# Patient Record
Sex: Female | Born: 1935 | Race: Black or African American | Hispanic: No | Marital: Married | Smoking: Never smoker
Health system: Southern US, Community
[De-identification: ages and names within clinical notes are randomized; demographics above are authoritative.]

## PROBLEM LIST (undated history)

## (undated) DIAGNOSIS — F0390 Unspecified dementia without behavioral disturbance: Secondary | ICD-10-CM

## (undated) DIAGNOSIS — I609 Nontraumatic subarachnoid hemorrhage, unspecified: Secondary | ICD-10-CM

## (undated) DIAGNOSIS — I10 Essential (primary) hypertension: Secondary | ICD-10-CM

## (undated) DIAGNOSIS — E7800 Pure hypercholesterolemia, unspecified: Secondary | ICD-10-CM

## (undated) HISTORY — PX: ABDOMINAL HYSTERECTOMY: SHX81

---
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
## 2007-04-18 ENCOUNTER — Emergency Department (HOSPITAL_COMMUNITY): Admission: EM | Admit: 2007-04-18 | Discharge: 2007-04-18 | Payer: Self-pay | Admitting: Emergency Medicine

## 2011-02-20 LAB — DIFFERENTIAL
Basophils Absolute: 0
Lymphocytes Relative: 10 — ABNORMAL LOW
Neutro Abs: 12.3 — ABNORMAL HIGH

## 2011-02-20 LAB — URINE MICROSCOPIC-ADD ON

## 2011-02-20 LAB — URINALYSIS, ROUTINE W REFLEX MICROSCOPIC
Glucose, UA: NEGATIVE
Protein, ur: NEGATIVE
Urobilinogen, UA: 0.2

## 2011-02-20 LAB — BASIC METABOLIC PANEL
BUN: 11
Calcium: 10.1
GFR calc non Af Amer: >60
Glucose, Bld: 95
Sodium: 132 — ABNORMAL LOW

## 2011-02-20 LAB — CBC
Platelets: 166
RDW: 13.6

## 2015-03-03 ENCOUNTER — Emergency Department (HOSPITAL_COMMUNITY): Payer: Medicare (Managed Care)

## 2015-03-03 ENCOUNTER — Inpatient Hospital Stay (HOSPITAL_COMMUNITY)
Admission: EM | Admit: 2015-03-03 | Discharge: 2015-03-11 | DRG: 065 | Disposition: A | Discharge status: Skilled Nursing Facility | Payer: Medicare (Managed Care) | Attending: Neurology | Admitting: Neurology

## 2015-03-03 ENCOUNTER — Encounter (HOSPITAL_COMMUNITY): Payer: Self-pay | Admitting: Emergency Medicine

## 2015-03-03 DIAGNOSIS — I619 Nontraumatic intracerebral hemorrhage, unspecified: Secondary | ICD-10-CM | POA: Diagnosis not present

## 2015-03-03 DIAGNOSIS — I610 Nontraumatic intracerebral hemorrhage in hemisphere, subcortical: Secondary | ICD-10-CM | POA: Diagnosis not present

## 2015-03-03 DIAGNOSIS — I10 Essential (primary) hypertension: Secondary | ICD-10-CM | POA: Diagnosis not present

## 2015-03-03 DIAGNOSIS — E119 Type 2 diabetes mellitus without complications: Secondary | ICD-10-CM | POA: Diagnosis present

## 2015-03-03 DIAGNOSIS — F0390 Unspecified dementia without behavioral disturbance: Secondary | ICD-10-CM | POA: Diagnosis not present

## 2015-03-03 DIAGNOSIS — I6629 Occlusion and stenosis of unspecified posterior cerebral artery: Secondary | ICD-10-CM | POA: Diagnosis present

## 2015-03-03 DIAGNOSIS — I612 Nontraumatic intracerebral hemorrhage in hemisphere, unspecified: Secondary | ICD-10-CM

## 2015-03-03 DIAGNOSIS — E785 Hyperlipidemia, unspecified: Secondary | ICD-10-CM | POA: Diagnosis present

## 2015-03-03 DIAGNOSIS — I161 Hypertensive emergency: Secondary | ICD-10-CM | POA: Diagnosis present

## 2015-03-03 DIAGNOSIS — R29810 Facial weakness: Secondary | ICD-10-CM | POA: Diagnosis present

## 2015-03-03 DIAGNOSIS — J1000 Influenza due to other identified influenza virus with unspecified type of pneumonia: Secondary | ICD-10-CM | POA: Diagnosis not present

## 2015-03-03 DIAGNOSIS — I69993 Ataxia following unspecified cerebrovascular disease: Secondary | ICD-10-CM | POA: Diagnosis not present

## 2015-03-03 HISTORY — DX: Unspecified dementia, unspecified severity, without behavioral disturbance, psychotic disturbance, mood disturbance, and anxiety: F03.90

## 2015-03-03 HISTORY — DX: Essential (primary) hypertension: I10

## 2015-03-03 HISTORY — DX: Pure hypercholesterolemia, unspecified: E78.00

## 2015-03-03 LAB — COMPREHENSIVE METABOLIC PANEL
ALBUMIN: 3.9 g/dL (ref 3.5–5.0)
ALK PHOS: 61 U/L (ref 38–126)
ALT: 14 U/L (ref 14–54)
AST: 26 U/L (ref 15–41)
Anion gap: 8 (ref 5–15)
BILIRUBIN TOTAL: 0.7 mg/dL (ref 0.3–1.2)
BUN: 21 mg/dL — AB (ref 6–20)
CO2: 23 mmol/L (ref 22–32)
CREATININE: 1.06 mg/dL — AB (ref 0.44–1.00)
Calcium: 10.2 mg/dL (ref 8.9–10.3)
Chloride: 106 mmol/L (ref 101–111)
GFR calc Af Amer: 51 mL/min — ABNORMAL LOW (ref >60)
GFR, EST NON AFRICAN AMERICAN: 44 mL/min — AB (ref >60)
GLUCOSE: 152 mg/dL — AB (ref 65–99)
Potassium: 4.3 mmol/L (ref 3.5–5.1)
Sodium: 137 mmol/L (ref 135–145)
Total Protein: 7.7 g/dL (ref 6.5–8.1)

## 2015-03-03 LAB — CBC
HCT: 35.3 % — ABNORMAL LOW (ref 36.0–46.0)
Hemoglobin: 12.3 g/dL (ref 12.0–15.0)
MCH: 28.1 pg (ref 26.0–34.0)
MCHC: 34.8 g/dL (ref 30.0–36.0)
MCV: 80.6 fL (ref 78.0–100.0)
PLATELETS: 160 10*3/uL (ref 150–400)
RBC: 4.38 MIL/uL (ref 3.87–5.11)
RDW: 13.6 % (ref 11.5–15.5)
WBC: 9.2 10*3/uL (ref 4.0–10.5)

## 2015-03-03 LAB — DIFFERENTIAL
BASOS ABS: 0 10*3/uL (ref 0.0–0.1)
Basophils Relative: 0 %
Eosinophils Absolute: 0.2 10*3/uL (ref 0.0–0.7)
Eosinophils Relative: 2 %
LYMPHS ABS: 2.6 10*3/uL (ref 0.7–4.0)
LYMPHS PCT: 29 %
MONOS PCT: 11 %
Monocytes Absolute: 1 10*3/uL (ref 0.1–1.0)
NEUTROS ABS: 5.4 10*3/uL (ref 1.7–7.7)
Neutrophils Relative %: 58 %

## 2015-03-03 LAB — I-STAT CHEM 8, ED
BUN: 24 mg/dL — AB (ref 6–20)
CREATININE: 1.1 mg/dL — AB (ref 0.44–1.00)
Calcium, Ion: 1.28 mmol/L (ref 1.13–1.30)
Chloride: 106 mmol/L (ref 101–111)
Glucose, Bld: 155 mg/dL — ABNORMAL HIGH (ref 65–99)
HEMATOCRIT: 40 % (ref 36.0–46.0)
HEMOGLOBIN: 13.6 g/dL (ref 12.0–15.0)
POTASSIUM: 4.3 mmol/L (ref 3.5–5.1)
Sodium: 138 mmol/L (ref 135–145)
TCO2: 23 mmol/L (ref 0–100)

## 2015-03-03 LAB — I-STAT TROPONIN, ED: Troponin i, poc: 0.01 ng/mL (ref 0.00–0.08)

## 2015-03-03 LAB — PROTIME-INR
INR: 1.15 (ref 0.00–1.49)
PROTHROMBIN TIME: 14.9 s (ref 11.6–15.2)

## 2015-03-03 LAB — MRSA PCR SCREENING: MRSA BY PCR: NEGATIVE

## 2015-03-03 LAB — CBG MONITORING, ED: Glucose-Capillary: 136 mg/dL — ABNORMAL HIGH (ref 65–99)

## 2015-03-03 LAB — APTT: aPTT: 31 seconds (ref 24–37)

## 2015-03-03 MED ORDER — ACETAMINOPHEN 325 MG PO TABS
650.0000 mg | ORAL_TABLET | ORAL | Status: DC | PRN
  Administered 2015-03-07: 650 mg via ORAL
  Filled 2015-03-03 (×2): qty 2

## 2015-03-03 MED ORDER — ACETAMINOPHEN 650 MG RE SUPP: 650.0000 mg | RECTAL | Status: DC | PRN

## 2015-03-03 MED ORDER — LABETALOL HCL 5 MG/ML IV SOLN
10.0000 mg | INTRAVENOUS | Status: DC | PRN
  Administered 2015-03-03 – 2015-03-04 (×4): 20 mg via INTRAVENOUS
  Filled 2015-03-03: qty 8
  Filled 2015-03-03 (×2): qty 4

## 2015-03-03 MED ORDER — STROKE: EARLY STAGES OF RECOVERY BOOK
Freq: Once | Status: AC
  Administered 2015-03-03: 23:00:00
  Filled 2015-03-03: qty 1

## 2015-03-03 MED ORDER — PANTOPRAZOLE SODIUM 40 MG IV SOLR
40.0000 mg | Freq: Every day | INTRAVENOUS | Status: DC
  Administered 2015-03-03: 40 mg via INTRAVENOUS
  Filled 2015-03-03: qty 40

## 2015-03-03 MED ORDER — SENNOSIDES-DOCUSATE SODIUM 8.6-50 MG PO TABS
1.0000 | ORAL_TABLET | Freq: Two times a day (BID) | ORAL | Status: DC
  Administered 2015-03-04 – 2015-03-11 (×13): 1 via ORAL
  Filled 2015-03-03 (×14): qty 1

## 2015-03-03 MED ORDER — INFLUENZA VAC SPLIT QUAD 0.5 ML IM SUSY: 0.5000 mL | PREFILLED_SYRINGE | INTRAMUSCULAR | Status: DC

## 2015-03-03 NOTE — H&P (Signed)
Neurology H&P Reason for Consult: Facial droop Referring Physician: Tyron Russell  CC: Facial droop  History is obtained from: Patient, family  HPI: Christine Kramer is a 81 y.o. female with a history of dementia, hypertension who presents with facial droop that started earlier today. She was brought into the emergency room at [facility] long where code stroke was activated. CT showed a small hemorrhage in the left corona radiata.  She currently continues to only have a mild right facial droop and is otherwise asymptomatic. She does have a history of dementia.   LKW: 3 PM tpa given?: no, hemorrhage    ROS: A 14 point ROS was performed and is negative except as noted in the HPI.   Past Medical History  Diagnosis Date  . Dementia   . High cholesterol   . Hypertension      History reviewed. No pertinent family history.   Social History:  has no tobacco, alcohol, and drug history on file.   Exam: Current vital signs: BP 173/73 mmHg  Pulse 54  Temp(Src) 97.8 F (36.6 C) (Oral)  Resp 20  Ht 5\' 6"  (1.676 m)  Wt 61.236 kg (135 lb)  BMI 21.80 kg/m2  SpO2 97% Vital signs in last 24 hours: Temp:  [97.8 F (36.6 C)-98 F (36.7 C)] 97.8 F (36.6 C) (10/19 1818) Pulse Rate:  [42-106] 54 (10/19 1930) Resp:  [17-31] 20 (10/19 1930) BP: (153-205)/(52-119) 173/73 mmHg (10/19 1930) SpO2:  [95 %-100 %] 97 % (10/19 1930) Weight:  [61.236 kg (135 lb)] 61.236 kg (135 lb) (10/19 1749)   Physical Exam  Constitutional: Appears well-developed and well-nourished.  Psych: Affect appropriate to situation Eyes: No scleral injection HENT: No OP obstrucion Head: Normocephalic.  Cardiovascular: Normal rate and regular rhythm.  Respiratory: Effort normal and breath sounds normal to anterior ascultation GI: Soft.  No distension. There is no tenderness.  Skin: WDI  Neuro: Mental Status: Patient is awake, alert, oriented to person,  Patient is able to give a clear and coherent history. No  signs of aphasia or neglect Cranial Nerves: II: Visual Fields are full. Pupils are equal, round, and reactive to light.   III,IV, VI: EOMI without ptosis or diploplia.  V: Facial sensation is symmetric to temperature VII: Facial movement is notable for right facial weakness VIII: hearing is intact to voice X: Uvula elevates symmetrically XI: Shoulder shrug is symmetric. XII: tongue is midline without atrophy or fasciculations.  Motor: Tone is normal. Bulk is normal. 5/5 strength was present in all four extremities.  Sensory: Sensation is symmetric to light touch and temperature in the arms and legs. Cerebellar: FNF intact bilaterally     I have reviewed labs in epic and the results pertinent to this consultation are: INR, plt normal  I have reviewed the images obtained: CT head - tiny focal hemorrage in the right corona radiata  Impression: 81 yo F with focal white matter hemorrhage. I suspect this is due to longstanding hypertension and she is already not on any blood thinners. I discussed code status with her and her son and she remains full code.   Recommendations: 1) Admit to ICU 2) no antiplatelets or anticoagulants 3) blood pressure control with goal systolic < 716, labetalol prn 4) Frequent neuro checks 5) If symptoms worsen or there is decreased mental status, repeat stat head CT 6) PT,OT,ST  This patient is critically ill and at significant risk of neurological worsening, death and care requires constant monitoring of vital signs, hemodynamics,respiratory  and cardiac monitoring, neurological assessment, discussion with family, other specialists and medical decision making of high complexity. I spent 40 minutes of neurocritical care time  in the care of  this patient.  Roland Rack, MD [facility] [phone]  If 7pm- 7am, please page neurology on call as listed in [place]. 03/03/2015  8:20 PM

## 2015-03-03 NOTE — ED Notes (Signed)
Pt transported to CT ?

## 2015-03-03 NOTE — ED Provider Notes (Signed)
Patient transfer from [facility] as a code stroke. On my exam does have a facial droop and her CT scan shows possible small intracranial hemorrhage. Neurology consulted and will admit to ICU.  Sherwood Gambler, MD 03/03/15 2337

## 2015-03-03 NOTE — ED Notes (Addendum)
Pt wheeled via wheelchair into room at present time. Alfonse Spruce MD at bedside upon pt arrival. Pt defect noted right side facial droop; no other defects noted.  Family report right side facial droop onset 1500 and slurred speech onset 1630. Family deny slurred speech at present time and agree with staff only right side facial droop at present time.

## 2015-03-03 NOTE — ED Notes (Signed)
Code stroke called

## 2015-03-03 NOTE — ED Notes (Signed)
Neuro at bedside.

## 2015-03-03 NOTE — ED Notes (Signed)
Family noticed patient's facial droop around 1500 this afternoon, slurred speech. Pt has hx of "memory problems" per family. Pt has right sided facial droop and obvious speech slurring

## 2015-03-03 NOTE — ED Notes (Signed)
Alfonse Spruce MD speaking to neurologist at present time.

## 2015-03-03 NOTE — ED Provider Notes (Signed)
CSN: [id number]     Arrival date & time 03/03/15  1710 History   First MD Initiated Contact with Patient 03/03/15 1731     Chief Complaint  Patient presents with  . Facial Droop     (Consider location/radiation/quality/duration/timing/severity/associated sxs/prior Treatment) HPI Comments: 81 y.o. Female with history of DM, HTN, high cholesterol presents for facial droop and slurred speech.  The patient was reportedly normal at 3 PM about 1.5 hours prior to presentation but her sisters then found her to be slurring her speech and to have a right sided facial droop and so was brought to the hospital.  No known injuries or falls.     Past Medical History  Diagnosis Date  . Dementia   . High cholesterol   . Hypertension    Past Surgical History  Procedure Laterality Date  . Abdominal hysterectomy     History reviewed. No pertinent family history. Social History  Substance Use Topics  . Smoking status: None  . Smokeless tobacco: None  . Alcohol Use: None   OB History    No data available     Review of Systems  Constitutional: Negative for fever and fatigue.  HENT: Negative for nosebleeds and rhinorrhea.   Eyes: Negative for visual disturbance.  Respiratory: Negative for chest tightness and shortness of breath.   Cardiovascular: Negative for chest pain and leg swelling.  Gastrointestinal: Negative for nausea, vomiting and abdominal pain.  Genitourinary: Negative for dysuria, urgency and frequency.  Musculoskeletal: Negative for myalgias and back pain.  Skin: Negative for rash.  Neurological: Positive for facial asymmetry and speech difficulty.  Hematological: Does not bruise/bleed easily.      Allergies  Review of patient's allergies indicates no known allergies.  Home Medications   Prior to Admission medications   Not on File   BP 168/90 mmHg  Pulse 106  Temp(Src) 98 F (36.7 C) (Oral)  Resp 30  Ht 5\' 6"  (1.676 m)  Wt 135 lb (61.236 kg)  BMI 21.80 kg/m2   SpO2 96% Physical Exam  Constitutional: She appears well-developed and well-nourished. No distress.  HENT:  Head: Normocephalic and atraumatic.  Right Ear: External ear normal.  Left Ear: External ear normal.  Nose: Nose normal.  Mouth/Throat: Oropharynx is clear and moist. No oropharyngeal exudate.  Eyes: EOM are normal. Pupils are equal, round, and reactive to light.  Neck: Normal range of motion. Neck supple.  Cardiovascular: Regular rhythm and intact distal pulses.  Tachycardia present.   Pulmonary/Chest: Effort normal. No respiratory distress. She has no wheezes. She has no rales.  Abdominal: Soft. She exhibits no distension. There is no tenderness.  Musculoskeletal: Normal range of motion. She exhibits no edema or tenderness.  Neurological: She is alert. She has normal strength. No sensory deficit. She exhibits normal muscle tone.  Oriented to person and place.  Patient with right sided facial droop and right sided tongue deviation.  No other focal neurologic deficit.  No arm or leg drift.  Full extra ocular movements.  NIHSS 1 according to my examination  Skin: Skin is warm and dry. No rash noted. She is not diaphoretic.  Vitals reviewed.   ED Course  Procedures (including critical care time) Labs Review Labs Reviewed  CBC - Abnormal; Notable for the following:    HCT 35.3 (*)    All other components within normal limits  COMPREHENSIVE METABOLIC PANEL - Abnormal; Notable for the following:    Glucose, Bld 152 (*)    BUN 21 (*)  Creatinine, Ser 1.06 (*)    GFR calc non Af Amer 44 (*)    GFR calc Af Amer 51 (*)    All other components within normal limits  CBG MONITORING, ED - Abnormal; Notable for the following:    Glucose-Capillary 136 (*)    All other components within normal limits  I-STAT CHEM 8, ED - Abnormal; Notable for the following:    BUN 24 (*)    Creatinine, Ser 1.10 (*)    Glucose, Bld 155 (*)    All other components within normal limits  DIFFERENTIAL   PROTIME-INR  APTT  I-STAT TROPOININ, ED    Imaging Review Ct Head Wo Contrast  03/03/2015  CLINICAL DATA:  Right-sided facial droop, slurred speech. EXAM: CT HEAD WITHOUT CONTRAST TECHNIQUE: Contiguous axial images were obtained from the base of the skull through the vertex without intravenous contrast. COMPARISON:  None. FINDINGS: There is a small area of high attenuation in the left corona radiata, measuring approximately 5 x 8 mm (series 2, image 17). No definitive evidence of an acute infarct, mass lesion, mass effect or hydrocephalus. Atrophy. Confluent low-attenuation in the periventricular deep white matter. Visualized portions of the paranasal sinuses and mastoid air cells are clear. IMPRESSION: 1. Focal high attenuation in the left corona radiata is worrisome for acute hemorrhage. Calcification is considered less likely. Critical Value/emergent results were called by telephone at the time of interpretation on 03/03/2015 at 5:47 pm to Dr. Lonia Skinner , who verbally acknowledged these results. 2. Atrophy and chronic microvascular white matter ischemic changes. Electronically Signed   By: Lorin Picket M.D.   On: 03/03/2015 17:48   I have personally reviewed and evaluated these images and lab results as part of my medical decision-making.   EKG Interpretation   Date/Time:  Wednesday March 03 2015 17:31:12 EDT Ventricular Rate:  50 PR Interval:  173 QRS Duration: 99 QT Interval:  620 QTC Calculation: 565 R Axis:   -63 Text Interpretation:  Sinus rhythm Biatrial enlargement Left anterior  fascicular block RSR' in V1 or V2, right VCD or RVH Consider anterior  infarct ST elevation, consider inferior injury Prolonged QT interval No  previous ECGs available prior to today but no signifcant change from one  15 minutes earlier Confirmed by NGUYEN, EMILY ([id number]) on 03/03/2015  6:07:06 PM      MDM  Patient seen and evaluated immediately in resus bay.  Code stroke paged. Discussed  with neurologist on the phone. Patient within TPA window but with low NIHSS and risk felt to outweigh benefit at this time and speech appeared to be improving as no slurring at this time but per son at bedside was present earlier.  As recommended by neurology no TPA given.  Patient to be transferred to [facility] and case was discussed with Dr. Regenia Skeeter.  CT with small possible hemorrhagic area on CT but patient without history of trauma or sign of trauma on examination.  Patietn transferred to [facility] for further treatment and evaluation with neurology team. Final diagnoses:  None    1. CVA  2. Facial droop    Harvel Quale, MD 03/03/15 [phone]

## 2015-03-03 NOTE — ED Notes (Signed)
Family at bedside. 

## 2015-03-04 ENCOUNTER — Inpatient Hospital Stay (HOSPITAL_COMMUNITY): Payer: Medicare (Managed Care)

## 2015-03-04 DIAGNOSIS — I610 Nontraumatic intracerebral hemorrhage in hemisphere, subcortical: Secondary | ICD-10-CM

## 2015-03-04 MED ORDER — PANTOPRAZOLE SODIUM 40 MG PO TBEC
40.0000 mg | DELAYED_RELEASE_TABLET | Freq: Every day | ORAL | Status: DC
  Administered 2015-03-04 – 2015-03-10 (×6): 40 mg via ORAL
  Filled 2015-03-04 (×7): qty 1

## 2015-03-04 MED ORDER — IOHEXOL 350 MG/ML SOLN
50.0000 mL | Freq: Once | INTRAVENOUS | Status: AC | PRN
Start: 2015-03-04 — End: 2015-03-04
  Administered 2015-03-04: 50 mL via INTRAVENOUS

## 2015-03-04 NOTE — Progress Notes (Signed)
OT Cancellation Note  Patient Details Name: Christine Kramer MRN: [id number] DOB: [birth year]   Cancelled Treatment:    Reason Eval/Treat Not Completed: Patient not medically ready (Active bedrest orders)  [place], OTR/L  [phone] 03/04/2015 03/04/2015, 6:54 AM

## 2015-03-04 NOTE — Progress Notes (Signed)
Rehab Admissions Coordinator Note:  Patient was screened by Retta Diones for appropriateness for an Inpatient Acute Rehab Consult.  At this time, we are recommending Inpatient Rehab consult.  Retta Diones 03/04/2015, 4:43 PM  I can be reached at [phone].

## 2015-03-04 NOTE — Evaluation (Signed)
Physical Therapy Evaluation Patient Details Name: Christine Kramer MRN: [id number] DOB: 10-09-1920 Today's Date: 03/04/2015   History of Present Illness  Christine Kramer is a 81 y.o. female with a history of dementia, hypertension who presents with facial droop that started earlier today.  CT showed a small hemorrhage in the left corona radiata.  Clinical Impression  Pt admitted with/for facial droop and found to have a small bleed..  Pt currently limited functionally due to the problems listed. ( See problems list.)   Pt will benefit from PT to maximize function and safety in order to get ready for next venue listed below.     Follow Up Recommendations CIR    Equipment Recommendations  Other (comment) (TBA)    Recommendations for Other Services Rehab consult     Precautions / Restrictions Precautions Precautions: Fall      Mobility  Bed Mobility Overal bed mobility: Needs Assistance Bed Mobility: Sidelying to Sit;Sit to Sidelying   Sidelying to sit: Min assist     Sit to sidelying: Min assist General bed mobility comments: cues for technique, frequent redirection to task.  Transfers Overall transfer level: Needs assistance Equipment used: Ambulation equipment used Transfers: Sit to/from Stand Sit to Stand: Mod assist         General transfer comment: assist to come forward and stability assist  Ambulation/Gait                Stairs            Wheelchair Mobility    Modified Rankin (Stroke Patients Only) Modified Rankin (Stroke Patients Only) Pre-Morbid Rankin Score: No significant disability Modified Rankin: Moderately severe disability     Balance Overall balance assessment: Needs assistance Sitting-balance support: Single extremity supported;Bilateral upper extremity supported Sitting balance-Leahy Scale: Poor Sitting balance - Comments: sat EOB x 5 min working on sitting balance, midline orientation.   Standing balance support: Bilateral  upper extremity supported Standing balance-Leahy Scale: Poor Standing balance comment: standing at EOB holding or pushing down on tray table.  Working on midline orientation, marching in place, general balance.                             Pertinent Vitals/Pain Pain Assessment: No/denies pain    Home Living Family/patient expects to be discharged to:: Private residence Living Arrangements: Spouse/significant other;Children (son in to help out.) Available Help at Discharge: Family (son staying with pt and husband presently) Type of Home: House Home Access: Stairs to enter Entrance Stairs-Rails: Psychiatric nurse of Steps: 1/5 Home Layout: One level        Prior Function Level of Independence: Independent         Comments: per pt she could dress and bathe herself and go to the bathroom herself     Hand Dominance        Extremity/Trunk Assessment   Upper Extremity Assessment: Overall WFL for tasks assessed           Lower Extremity Assessment: Generalized weakness;Overall [facility] for tasks assessed (proximal and truncal weaknesses)         Communication   Communication: No difficulties  Cognition Arousal/Alertness: Awake/alert Behavior During Therapy: WFL for tasks assessed/performed Overall Cognitive Status: Impaired/Different from baseline Area of Impairment: Orientation;Following commands;Safety/judgement;Awareness;Problem solving;Memory;Attention Orientation Level: Time;Place;Situation Current Attention Level: Sustained Memory: Decreased short-term memory Following Commands: Follows one step commands with increased time Safety/Judgement: Decreased awareness of safety;Decreased awareness of deficits Awareness:  Intellectual Problem Solving: Slow processing      General Comments General comments (skin integrity, edema, etc.): Treatment limited due to BP climbing into the 180-190's / 80's and 90's    Exercises         Assessment/Plan    PT Assessment Patient needs continued PT services  PT Diagnosis Generalized weakness;Difficulty walking   PT Problem List Decreased strength;Decreased activity tolerance;Decreased balance;Decreased mobility;Decreased cognition;Decreased knowledge of use of DME  PT Treatment Interventions DME instruction;Gait training;Functional mobility training;Therapeutic activities;Balance training;Patient/family education;Neuromuscular re-education   PT Goals (Current goals can be found in the Care Plan section) Acute Rehab PT Goals Patient Stated Goal: pt wants to get back home PT Goal Formulation: With patient Time For Goal Achievement: 03/18/15 Potential to Achieve Goals: Good    Frequency Min 3X/week   Barriers to discharge        Co-evaluation               End of Session   Activity Tolerance: Patient tolerated treatment well;Patient limited by fatigue Patient left: in bed;with call bell/phone within reach;with family/visitor present;with bed alarm set Nurse Communication: Mobility status         Time: 1450-1520 PT Time Calculation (min) (ACUTE ONLY): 30 min   Charges:   PT Evaluation $Initial PT Evaluation Tier I: 1 Procedure PT Treatments $Therapeutic Activity: 8-22 mins   PT G Codes:        Rickayla Wieland, Tessie Fass 03/04/2015, 3:53 PM 03/04/2015  Donnella Sham, PT [phone]  (pager)

## 2015-03-04 NOTE — Evaluation (Signed)
Clinical/Bedside Swallow Evaluation Patient Details  Name: Christine Kramer MRN: [id number] Date of Birth: 01/03/1921  Today's Date: 03/04/2015 Time: SLP Start Time (ACUTE ONLY): 55 SLP Stop Time (ACUTE ONLY): 1123 SLP Time Calculation (min) (ACUTE ONLY): 20 min  Past Medical History:  Past Medical History  Diagnosis Date  . Dementia   . High cholesterol   . Hypertension    Past Surgical History:  Past Surgical History  Procedure Laterality Date  . Abdominal hysterectomy     HPI:  Christine Kramer is a 81 y.o. female with a history of dementia, hypertension who presents with facial droop. CT showed a small hemorrhage in the left corona radiata. Patient initially passed RN stroke swallow screen however coughing noted by nursing when transferred to ICU from ED. SLP swallow evaluation ordered.    Assessment / Plan / Recommendation Clinical Impression  Patient presents with a functional oropharyngeal swallow. Mild oral phase impairements noted due to subtle right sided facial weakness. Mild right sided buccal pocketing noted with full patient awareness and clearance utilizing right lingual sweep. Educated patient on consistent use of lingual sweep with regular diet, thin liquid to increase swallow safety. SLP will f/u briefly.     Aspiration Risk  Mild    Diet Recommendation Age appropriate regular solids;Thin   Medication Administration: Whole meds with liquid Compensations: Slow rate;Small sips/bites;Check for pocketing (check for right sided pocketing)    Other  Recommendations Oral Care Recommendations: Oral care BID      Frequency and Duration min 1 x/week  1 week   Pertinent Vitals/Pain n/a     Swallow Study    General Other Pertinent Information: Christine Kramer is a 81 y.o. female with a history of dementia, hypertension who presents with facial droop. CT showed a small hemorrhage in the left corona radiata. Patient initially passed RN stroke swallow screen however  coughing noted by nursing when transferred to ICU from ED. SLP swallow evaluation ordered.  Type of Study: Bedside swallow evaluation Previous Swallow Assessment: none noted Diet Prior to this Study: NPO Temperature Spikes Noted: No Respiratory Status: Room air History of Recent Intubation: No Behavior/Cognition: Alert;Cooperative;Pleasant mood Oral Cavity - Dentition: Adequate natural dentition/normal for age Self-Feeding Abilities: Able to feed self Patient Positioning: Upright in bed Baseline Vocal Quality: Normal Volitional Cough: Strong Volitional Swallow: Able to elicit    Oral/Motor/Sensory Function Overall Oral Motor/Sensory Function: Impaired (subtle right sided lingual/facial weakness)   Ice Chips Ice chips: Not tested   Thin Liquid Thin Liquid: Within functional limits Presentation: Cup;Self Fed;Straw    Nectar Thick Nectar Thick Liquid: Not tested   Honey Thick Honey Thick Liquid: Not tested   Puree Puree: Within functional limits Presentation: Self Fed;Spoon   Solid   GO   Tadarrius Burch MA, CCC-SLP [phone]  Solid: Within functional limits (with the exception of mild right sided buccal pocketing) Presentation: Self Fed       Kalianne Fetting Meryl 03/04/2015,11:34 AM

## 2015-03-04 NOTE — Progress Notes (Signed)
STROKE TEAM PROGRESS NOTE   HISTORY Christine Kramer is a 81 y.o. female with a history of dementia, hypertension who presents with facial droop that started earlier today (LKW 03/03/2015 at 1500). She was brought into the emergency room at [facility] where code stroke was activated. CT showed a small hemorrhage in the left corona radiata.   She currently continues to only have a mild right facial droop and is otherwise asymptomatic. She does have a history of dementia.  Patient was not administered TPA secondary to [place]. She was admitted to the neuro ICU for further evaluation and treatment.   SUBJECTIVE (INTERVAL HISTORY) No family is at the bedside.  Overall she feels her condition is stable.    OBJECTIVE Temp:  [97.4 F (36.3 C)-98 F (36.7 C)] 97.6 F (36.4 C) (10/20 0735) Pulse Rate:  [42-106] 58 (10/20 0800) Cardiac Rhythm:  [-] Normal sinus rhythm (10/20 0000) Resp:  [17-31] 19 (10/20 0800) BP: (82-200)/(49-130) 139/53 mmHg (10/20 0800) SpO2:  [92 %-100 %] 93 % (10/20 0800) Weight:  [61.236 kg (135 lb)-63.3 kg (139 lb 8.8 oz)] 63.3 kg (139 lb 8.8 oz) (10/19 2100)  CBC:   Recent Labs Lab 03/03/15 1733 03/03/15 1741  WBC 9.2  --   NEUTROABS 5.4  --   HGB 12.3 13.6  HCT 35.3* 40.0  MCV 80.6  --   PLT 160  --     Basic Metabolic Panel:   Recent Labs Lab 03/03/15 1733 03/03/15 1741  NA 137 138  K 4.3 4.3  CL 106 106  CO2 23  --   GLUCOSE 152* 155*  BUN 21* 24*  CREATININE 1.06* 1.10*  CALCIUM 10.2  --     Lipid Panel: No results found for: CHOL, TRIG, HDL, CHOLHDL, VLDL, LDLCALC HgbA1c: No results found for: HGBA1C Urine Drug Screen: No results found for: LABOPIA, COCAINSCRNUR, LABBENZ, AMPHETMU, THCU, LABBARB    IMAGING  Ct Head Wo Contrast  03/03/2015   1. Focal high attenuation in the left corona radiata is worrisome for acute hemorrhage. Calcification is considered less likely.    PHYSICAL EXAM Frail petite elderly African-American lady not in  distress. . Afebrile. Head is nontraumatic. Neck is supple without bruit.    Cardiac exam no murmur or gallop. Lungs are clear to auscultation. Distal pulses are well felt. Neurological Exam :  Awake alert oriented to person only. Diminished attention, registration and recall. Speech is fluent without dysarthria or aphasia. Extraocular movements are full range without nystagmus. Fundi were not visualized. Vision acuity seems adequate. Visual fields seem full to bedside confrontational testing. Right lower facial mild weakness. Tongue midline. Motor system exam no upper or lower extremity drift. Symmetric and equal strength in all 4 extremities. No focal weakness. Touch and sensation is intact bilaterally. Coordination slow but accurate. Gait was not tested. ASSESSMENT/PLAN [name] is a 81 y.o. female with history of dementia, hypertension presenting with facial droop. CT showed L corona radiata hemorrhage.   Stroke:   left corona radiata hemorrhage most likely secondary to  acclerated hypertension  Resultant  Facial droop improved  Check CTA head   SCDs for VTE prophylaxis Diet NPO time specified  No antithrombotic prior to admission   Ongoing aggressive stroke risk factor management  Keep in ICU today. Plan transfer to floor in am  Therapy recommendations:  Pending, ok to be OOB with therapy evals today  Disposition:  pending (unsure of living situation prior to admission)  Accelerated Hypertension  BP on arrival 199/52, 200/84  Stable currently with BP 142/64  SBP goal < 160  Hyperlipidemia  Home meds:  Fish oil  Continue statin at discharge  Other Stroke Risk Factors  Advanced age  Other Active Problems  Baseline dementia  Hospital day # [place] for Pager information 03/04/2015 9:38 AM  I have personally examined this patient, reviewed notes, independently viewed imaging studies, participated in medical  decision making and plan of care. I have made any additions or clarifications directly to the above note. Agree with note above.  She presented with right facial droop secondary to small left basal ganglia hemorrhage likely due to hypertension. She remains at risk for neurological worsening, hematoma expansion, hydrocephalus and needs tight blood pressure control and close neurological monitoring. Plan to repeat CT scan in 24 hours and keep tight blood pressure control. No family available at the bedside. This patient is critically ill and at significant risk of neurological worsening, death and care requires constant monitoring of vital signs, hemodynamics,respiratory and cardiac monitoring, extensive review of multiple databases, frequent neurological assessment, discussion with family, other specialists and medical decision making of high complexity.I have made any additions or clarifications directly to the above note.This critical care time does not reflect procedure time, or teaching time or supervisory time of PA/NP/Med Resident etc but could involve care discussion time.  I spent 30 minutes of neurocritical care time  in the care of  this patient.    Antony Contras, MD Medical Director [facility] Pager: [phone] 03/04/2015 3:30 PM    To contact Stroke Continuity provider, please refer to [email or web address] After hours, contact General Neurology

## 2015-03-05 ENCOUNTER — Encounter (HOSPITAL_COMMUNITY): Payer: Self-pay | Admitting: *Deleted

## 2015-03-05 DIAGNOSIS — I69993 Ataxia following unspecified cerebrovascular disease: Secondary | ICD-10-CM

## 2015-03-05 DIAGNOSIS — F0390 Unspecified dementia without behavioral disturbance: Secondary | ICD-10-CM

## 2015-03-05 DIAGNOSIS — I619 Nontraumatic intracerebral hemorrhage, unspecified: Secondary | ICD-10-CM

## 2015-03-05 DIAGNOSIS — J1000 Influenza due to other identified influenza virus with unspecified type of pneumonia: Secondary | ICD-10-CM

## 2015-03-05 MED ORDER — ADULT MULTIVITAMIN W/MINERALS CH
1.0000 | ORAL_TABLET | Freq: Every day | ORAL | Status: DC
  Administered 2015-03-06 – 2015-03-11 (×6): 1 via ORAL
  Filled 2015-03-05 (×6): qty 1

## 2015-03-05 MED ORDER — CALCIUM CARBONATE-VITAMIN D 500-200 MG-UNIT PO TABS
1.0000 | ORAL_TABLET | Freq: Every day | ORAL | Status: DC
  Administered 2015-03-06 – 2015-03-11 (×6): 1 via ORAL
  Filled 2015-03-05 (×6): qty 1

## 2015-03-05 MED ORDER — LATANOPROST 0.005 % OP SOLN
1.0000 [drp] | Freq: Every day | OPHTHALMIC | Status: DC
  Administered 2015-03-05 – 2015-03-10 (×4): 1 [drp] via OPHTHALMIC
  Filled 2015-03-05 (×2): qty 2.5

## 2015-03-05 MED ORDER — QUINAPRIL HCL 10 MG PO TABS: 20.0000 mg | ORAL_TABLET | Freq: Every day | ORAL | Status: DC

## 2015-03-05 MED ORDER — LISINOPRIL 20 MG PO TABS
20.0000 mg | ORAL_TABLET | Freq: Every day | ORAL | Status: DC
  Administered 2015-03-06 – 2015-03-11 (×6): 20 mg via ORAL
  Filled 2015-03-05 (×6): qty 1

## 2015-03-05 MED ORDER — AMLODIPINE BESYLATE 2.5 MG PO TABS
2.5000 mg | ORAL_TABLET | Freq: Every day | ORAL | Status: DC
Start: 2015-03-05 — End: 2015-03-09
  Administered 2015-03-05 – 2015-03-08 (×4): 2.5 mg via ORAL
  Filled 2015-03-05 (×4): qty 1

## 2015-03-05 NOTE — Care Management Note (Signed)
Case Management Note  Patient Details  Name: Christine Kramer MRN: [id number] Date of Birth: [birth year]  Subjective/Objective:                    Action/Plan:  Initial UR completed  Expected Discharge Date:                  Expected Discharge Plan:  [place]  In-House Referral:     Discharge planning Services     Post Acute Care Choice:    Choice offered to:     DME Arranged:    DME Agency:     HH Arranged:    [place] Agency:     Status of Service:  In process, will continue to follow  Medicare Important Message Given:    Date Medicare IM Given:    Medicare IM give by:    Date Additional Medicare IM Given:    Additional Medicare Important Message give by:     If discussed at [place] of Stay Meetings, dates discussed:    Additional Comments:  Marilu Favre, RN 03/05/2015, 8:40 AM

## 2015-03-05 NOTE — Progress Notes (Signed)
STROKE TEAM PROGRESS NOTE   SUBJECTIVE (INTERVAL HISTORY) Patient with out complaints. CT angiogram shows stable appearance of the left basal ganglia hemorrhage and no significant left MCA or ICA stenosis. There is asymptomatic moderate right M2 middle cerebral artery stenosis   OBJECTIVE Temp:  [97.4 F (36.3 C)-98.5 F (36.9 C)] 98 F (36.7 C) (10/21 0755) Pulse Rate:  [44-77] 71 (10/21 0900) Cardiac Rhythm:  [-] Normal sinus rhythm (10/21 0800) Resp:  [20-34] 22 (10/21 0900) BP: (98-189)/(41-123) 137/76 mmHg (10/21 0900) SpO2:  [90 %-99 %] 95 % (10/21 0900)  CBC:   Recent Labs Lab 03/03/15 1733 03/03/15 1741  WBC 9.2  --   NEUTROABS 5.4  --   HGB 12.3 13.6  HCT 35.3* 40.0  MCV 80.6  --   PLT 160  --     Basic Metabolic Panel:   Recent Labs Lab 03/03/15 1733 03/03/15 1741  NA 137 138  K 4.3 4.3  CL 106 106  CO2 23  --   GLUCOSE 152* 155*  BUN 21* 24*  CREATININE 1.06* 1.10*  CALCIUM 10.2  --     Lipid Panel: No results found for: CHOL, TRIG, HDL, CHOLHDL, VLDL, LDLCALC HgbA1c: No results found for: HGBA1C Urine Drug Screen: No results found for: LABOPIA, COCAINSCRNUR, LABBENZ, AMPHETMU, THCU, LABBARB    IMAGING  Ct Head Wo Contrast 03/03/2015   1. Focal high attenuation in the left corona radiata is worrisome for acute hemorrhage. Calcification is considered less likely.   CT angio Head  03/04/2015 1. Stable focus of hemorrhage within the left centrum semi ovale. 2. Stable atrophy and white matter disease without expansion of the infarct or new infarcts. 3. High-grade stenosis in the anterior right M2 division. 4. At least 60% stenosis within the cavernous right internal carotid artery. The lumen measurement is distal calcification in the stenosis within the calcified segment may be greater than this. 5. Asymmetric attenuation of anterior right MCA branches. 6. Moderate proximal PCA stenoses bilaterally.   PHYSICAL EXAM Frail petite elderly  African-American lady not in distress. . Afebrile. Head is nontraumatic. Neck is supple without bruit.    Cardiac exam no murmur or gallop. Lungs are clear to auscultation. Distal pulses are well felt. Neurological Exam :  Awake alert oriented to person only. Diminished attention, registration and recall. Speech is fluent without dysarthria or aphasia. Extraocular movements are full range without nystagmus. Fundi were not visualized. Vision acuity seems adequate. Visual fields seem full to bedside confrontational testing. Right lower facial mild weakness. Tongue midline. Motor system exam no upper or lower extremity drift. Symmetric and equal strength in all 4 extremities. No focal weakness. Touch and sensation is intact bilaterally. Coordination slow but accurate. Gait was not tested.   ASSESSMENT/PLAN [name] is a 81 y.o. female with history of dementia, hypertension presenting with facial droop. CT showed L corona radiata hemorrhage.   Stroke:   left corona radiata hemorrhage most likely secondary to  acclerated hypertension  Resultant  Facial droop improved  CTA head   Asymptomatic R stenosis. No correctable large vessel stenosis   SCDs for VTE prophylaxis Diet regular Room service appropriate?: Yes; Fluid consistency:: Thin  No antithrombotic prior to admission   Ongoing aggressive stroke risk factor management  transfer to floor  Therapy recommendations:  CIR. Consult ordered  Disposition:  pending (lives alone, husband in NH.)  Accelerated Hypertension  BP on arrival 199/52, 200/84  Stable currently with BP 142/64  SBP goal <  160  Add low dose norvasc  Hyperlipidemia  Home meds:  Fish oil  Continue statin at discharge  Other Stroke Risk Factors  Advanced age  Other Active Problems  Baseline dementia  Hospital day # [place] for Pager information 03/05/2015 9:51 AM  I have personally examined this  patient, reviewed notes, independently viewed imaging studies, participated in medical decision making and plan of care. I have made any additions or clarifications directly to the above note. Agree with note above.. Transfer to the neurology floor today. Mobilize out of bed and therapy and rehabilitation consults.  Antony Contras, MD Medical Director [facility] Pager: [phone] 03/05/2015 4:50 PM    To contact Stroke Continuity provider, please refer to [email or web address] After hours, contact General Neurology

## 2015-03-05 NOTE — Progress Notes (Signed)
Patient arrived to 5M14 at 1700. Report given by Babs Bertin. Patient alert with no c/o pain. Vital signs taken and charted. Oriented to room with bed alarm on.

## 2015-03-05 NOTE — Evaluation (Signed)
Speech Language Pathology Evaluation Patient Details Name: Christine Kramer MRN: [id number] DOB: 06/13/1920 Today's Date: 03/05/2015 Time: 7169-6789 SLP Time Calculation (min) (ACUTE ONLY): 27 min  Problem List:  Patient Active Problem List   Diagnosis Date Noted  . ICH (intracerebral hemorrhage) ([place]) 03/03/2015   Past Medical History:  Past Medical History  Diagnosis Date  . Dementia   . High cholesterol   . Hypertension    Past Surgical History:  Past Surgical History  Procedure Laterality Date  . Abdominal hysterectomy     HPI:  Christine Kramer is a 81 y.o. female with a history of dementia, hypertension who presents with facial droop. CT showed a small hemorrhage in the left corona radiata. Pt lives alone; husband is in SNF.    Assessment / Plan / Recommendation Clinical Impression  Pt presents with normal language and fluency, mild dysarthria, and cognitive deficits marked by disorientation (states she is in [place], husband still working, it is 1973), difficulty with storage and retrieval of new information.  Pt had difficulty with placement of numbers in clock drawing - recognized the deficits but unable to initiate repair strategy.  Recommend SLP f/u while in acute care; 24/7 supervision initially given cognition.      SLP Assessment  Patient needs continued Speech Lanaguage Pathology Services    Follow Up Recommendations   (tba)    Frequency and Duration min 1 x/week  2 weeks   Pertinent Vitals/Pain Pain Assessment: No/denies pain   SLP Goals  Potential to Achieve Goals (ACUTE ONLY): Good  SLP Evaluation Prior Functioning  Cognitive/Linguistic Baseline: Information not available Type of Home: House   Cognition  Overall Cognitive Status: Impaired/Different from baseline Arousal/Alertness: Awake/alert Orientation Level: Oriented to person;Disoriented to situation;Disoriented to time;Disoriented to place Attention: Sustained Sustained Attention: Appears  intact Memory: Impaired Memory Impairment: Storage deficit;Retrieval deficit Awareness: Impaired Problem Solving: Impaired Executive Function: Sequencing;Decision Making Sequencing: Impaired Sequencing Impairment: Verbal basic Decision Making: Impaired Decision Making Impairment: Verbal basic Safety/Judgment: Impaired    Comprehension  Auditory Comprehension Overall Auditory Comprehension: Appears within functional limits for tasks assessed Visual Recognition/Discrimination Discrimination: Within Function Limits Reading Comprehension Reading Status: Within funtional limits    Expression Expression Primary Mode of Expression: Verbal Verbal Expression Overall Verbal Expression: Appears within functional limits for tasks assessed Written Expression Dominant Hand: Right Written Expression: Within Functional Limits   Oral / Motor Oral Motor/Sensory Function Overall Oral Motor/Sensory Function:  (mild right VII deficit) Motor Speech Overall Motor Speech: Appears within functional limits for tasks assessed (very mild dysarthria)   Davionna Blacksher L. Tivis Ringer, [place] CCC/SLP Pager [phone]     Juan Quam [place]ice 03/05/2015, 10:42 AM

## 2015-03-05 NOTE — Progress Notes (Signed)
Physical Therapy Treatment Patient Details Name: Christine Kramer MRN: [id number] DOB: January 02, 1921 Today's Date: 03/05/2015    History of Present Illness Christine Kramer is a 81 y.o. female with a history of dementia, hypertension who presents with facial droop that started earlier today.  CT showed a small hemorrhage in the left corona radiata.    PT Comments    Pt very agreeable to mobility and eager for OOB.  Pt able to ambulate today with MinA throughout ambulation.  Continue to feel pt would benefit from CIR at D/C.  Will continue to follow.    Follow Up Recommendations  CIR     Equipment Recommendations  None recommended by PT    Recommendations for Other Services       Precautions / Restrictions Precautions Precautions: Fall Restrictions Weight Bearing Restrictions: No    Mobility  Bed Mobility Overal bed mobility: Needs Assistance Bed Mobility: Supine to Sit   Sidelying to sit: Min assist       General bed mobility comments: A with bringing trunk up to sitting.    Transfers Overall transfer level: Needs assistance Equipment used: Rolling walker (2 wheeled) Transfers: Sit to/from [facility] Sit to Stand: Mod assist Stand pivot transfers: Mod assist       General transfer comment: cues for UE use and A with coming up to standing.    Ambulation/Gait Ambulation/Gait assistance: Min assist (2nd person for chair follow.  ) Ambulation Distance (Feet): 40 Feet Assistive device: Rolling walker (2 wheeled) Gait Pattern/deviations: Step-through pattern;Decreased stride length;Trunk flexed     General Gait Details: cues for safe use of RW, upright posture and encouragement.     Stairs            Wheelchair Mobility    Modified Rankin (Stroke Patients Only) Modified Rankin (Stroke Patients Only) Pre-Morbid Rankin Score: No significant disability Modified Rankin: Moderately severe disability     Balance Overall balance assessment:  Needs assistance Sitting-balance support: No upper extremity supported;Feet supported Sitting balance-Leahy Scale: Fair     Standing balance support: Bilateral upper extremity supported;During functional activity Standing balance-Leahy Scale: Poor                      Cognition Arousal/Alertness: Awake/alert Behavior During Therapy: WFL for tasks assessed/performed Overall Cognitive Status: Impaired/Different from baseline Area of Impairment: Orientation;Following commands;Safety/judgement;Awareness;Problem solving;Memory;Attention Orientation Level: Time;Situation Current Attention Level: Selective Memory: Decreased short-term memory Following Commands: Follows one step commands with increased time Safety/Judgement: Decreased awareness of safety;Decreased awareness of deficits Awareness: Intellectual Problem Solving: Slow processing;Difficulty sequencing;Requires verbal cues;Requires tactile cues      Exercises      General Comments        Pertinent Vitals/Pain Pain Assessment: No/denies pain    Home Living       Type of Home: House              Prior Function            PT Goals (current goals can now be found in the care plan section) Acute Rehab PT Goals Patient Stated Goal: pt wants to get back home PT Goal Formulation: With patient Time For Goal Achievement: 03/18/15 Potential to Achieve Goals: Good Progress towards PT goals: Progressing toward goals    Frequency  Min 3X/week    PT Plan Current plan remains appropriate    Co-evaluation             End of Session Equipment Utilized  During Treatment: Gait belt Activity Tolerance: Patient tolerated treatment well Patient left: in chair;with call bell/phone within reach;with chair alarm set     Time: 4383-8184 PT Time Calculation (min) (ACUTE ONLY): 19 min  Charges:  $Gait Training: 8-22 mins                    G CodesCatarina Hartshorn, [place] 03/05/2015, 12:27  PM

## 2015-03-05 NOTE — Evaluation (Signed)
Occupational Therapy Evaluation Patient Details Name: Christine Kramer MRN: [id number] DOB: 03-04-1921 Today's Date: 03/05/2015    History of Present Illness Christine Kramer is a 81 y.o. female with a history of dementia, hypertension who presents with facial droop that started earlier today.  CT showed a small hemorrhage in the left corona radiata.   Clinical Impression   Pt was able to perform personal care independently prior to admission. Presents with impaired cognition, decreased balance and generalized weakness. Per her sisters, who are bedside, pt is close to her baseline in cognition and was unsafe with ambulation prior to this event, but was refusing a walker.  Will follow acutely.    Follow Up Recommendations  CIR;Supervision/Assistance - 24 hour    Equipment Recommendations       Recommendations for Other Services       Precautions / Restrictions Precautions Precautions: Fall Restrictions Weight Bearing Restrictions: No      Mobility Bed Mobility        General bed mobility comments: pt in chair  Transfers Overall transfer level: Needs assistance Equipment used: Rolling walker (2 wheeled) Transfers: Sit to/from [facility] Sit to Stand: Min assist Stand pivot transfers: Mod assist       General transfer comment: assist to rise and gain balance, uses momentum, needed 2 trials    Balance Overall balance assessment: Needs assistance Sitting-balance support: No upper extremity supported;Feet supported Sitting balance-Leahy Scale: Good Sitting balance - Comments: able to balance manipulate socks in chair   Standing balance support: Bilateral upper extremity supported;During functional activity Standing balance-Leahy Scale: Poor                              ADL Overall ADL's : Needs assistance/impaired Eating/Feeding: Independent;Sitting   Grooming: Wash/dry hands;Standing;Min guard   Upper Body Bathing: Supervision/  safety;Set up;Sitting   Lower Body Bathing: Sit to/from stand;Minimal assistance   Upper Body Dressing : Supervision/safety;Set up;Sitting   Lower Body Dressing: Sit to/from stand;Minimal assistance (donned and doffed socks)   Toilet Transfer: Minimal assistance;Stand-pivot;BSC   Toileting- Clothing Manipulation and Hygiene: Minimal assistance;Sit to/from stand       Functional mobility during ADLs: Minimal assistance;Rolling walker       Vision Vision Assessment?: No apparent visual deficits   Perception     Praxis      Pertinent Vitals/Pain Pain Assessment: No/denies pain     Hand Dominance Right   Extremity/Trunk Assessment Upper Extremity Assessment Upper Extremity Assessment: Overall WFL for tasks assessed   Lower Extremity Assessment Lower Extremity Assessment: Generalized weakness       Communication Communication Communication: HOH   Cognition Arousal/Alertness: Awake/alert Behavior During Therapy: WFL for tasks assessed/performed Overall Cognitive Status:  (Per sisters, pt is close to her baseline.) Area of Impairment: Orientation;Following commands;Safety/judgement;Awareness;Problem solving;Memory;Attention Orientation Level: Time;Situation Current Attention Level: Selective Memory: Decreased short-term memory Following Commands: Follows one step commands with increased time Safety/Judgement: Decreased awareness of safety;Decreased awareness of deficits Awareness: Intellectual Problem Solving: Slow processing;Difficulty sequencing;Requires verbal cues;Requires tactile cues     General Comments       Exercises       Shoulder Instructions      Home Living Family/patient expects to be discharged to:: Private residence Living Arrangements: Children (son has been staying with pt since March '16) Available Help at Discharge: Family;Available 24 hours/day (husband is currently in rehab in a SNF in [place]) Type of Home: [place]  Access: Stairs to  enter [employer] of Steps: 1/5 Entrance Stairs-Rails: Right;Left Home Layout: One level;Laundry or work area in basement     [employer] Shower/Tub: Teacher, early years/pre: [place] - single point (has a walker, unsure if rolling)   Additional Comments: information gained from sisters of pt      Prior Functioning/Environment Level of Independence: Needs assistance  Gait / Transfers Assistance Needed: ambulated with a cane and holding furniture, had refused walker ADL's / Homemaking Assistance Needed: Pt could bathe, dress and toilet.  Son did cooking and most of cleaning.        OT Diagnosis: Generalized weakness;Cognitive deficits   OT Problem List: Decreased strength;Decreased activity tolerance;Impaired balance (sitting and/or standing);Decreased cognition;Decreased safety awareness;Decreased knowledge of use of DME or AE   OT Treatment/Interventions: Self-care/ADL training;DME and/or AE instruction;Patient/family education;Balance training    OT Goals(Current goals can be found in the care plan section) Acute Rehab OT Goals Patient Stated Goal: pt wants to get back home OT Goal Formulation: Patient unable to participate in goal setting Time For Goal Achievement: 03/12/15 Potential to Achieve Goals: Good ADL Goals Pt Will Perform Grooming: with supervision;standing Pt Will Perform Upper Body Bathing: with supervision;sitting Pt Will Perform Lower Body Bathing: with supervision;sit to/from stand Pt Will Perform Upper Body Dressing: with supervision;sitting Pt Will Perform Lower Body Dressing: with supervision;sit to/from stand Pt Will Transfer to Toilet: with supervision;ambulating;regular height toilet Pt Will Perform Toileting - Clothing Manipulation and hygiene: with supervision;sit to/from stand  OT Frequency: Min 2X/week   Barriers to D/C:            Co-evaluation              End of Session Equipment  Utilized During Treatment: Gait belt  Activity Tolerance: Patient tolerated treatment well Patient left: in chair;with family/visitor present   Time: 1415-1450 OT Time Calculation (min): 35 min Charges:  OT General Charges $OT Visit: 1 Procedure OT Evaluation $Initial OT Evaluation Tier I: 1 Procedure OT Treatments $Self Care/Home Management : 8-22 mins G-Codes:    Malka So 03/05/2015, 2:56 PM  [phone]

## 2015-03-05 NOTE — Care Management Important Message (Signed)
Important Message  Patient Details  Name: Christine Kramer MRN: [id number] Date of Birth: 1920/12/17   Medicare Important Message Given:  Yes-second notification given    Nathen May 03/05/2015, 12:10 PM

## 2015-03-05 NOTE — Consult Note (Signed)
Physical Medicine and Rehabilitation Consult  Reason for Consult:  Right facial weakness with slurred speech Referring Physician:  Dr. Leonie Man   HPI: AMIAYA Kramer is a 81 y.o. female with history of HTN, dementia who was admitted on 03/03/15 with mild right facial droop.  BP elevated at admission and  CT head done revealing small hemorrhage in left corona radiata and small vessel disease. CTA brain done 10/20 showing stable focus of hemorrhage, high grade stenosis right M2 division, moderate proximal B-PCA stenosis and 60% stenosis cavernous R-ICA. Neurology consulted for input and felt that bleed due to accelerated HTN. Swallow evaluation with evidence of mild right pocketing and patient with full awareness. Cleared for regular diet. PT evaluation done yesterday and CIR recommended due to balance deficits. Pt perseverates on someone dropping her off at the hospital and leaving her here.  She is confused as to how she got here from [place] (where her home is)  Review of Systems  Unable to perform ROS: mental acuity      Past Medical History  Diagnosis Date  . Dementia   . High cholesterol   . Hypertension     Past Surgical History  Procedure Laterality Date  . Abdominal hysterectomy      History reviewed. No pertinent family history.    Social History:  Married. 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  Also reports that Son living with her as husband in SNF?  Does not use tobacco, alcohol, and illicit drugs.     Allergies: No Known Allergies    Medications Prior to Admission  Medication Sig Dispense Refill  . calcium-vitamin D (OSCAL WITH D) 500-200 MG-UNIT tablet Take 1 tablet by mouth daily with breakfast.    . latanoprost (XALATAN) 0.005 % ophthalmic solution Place 1 drop into both eyes at bedtime.  2  . mirtazapine (REMERON) 15 MG tablet Take 7.5 mg by mouth at bedtime.  0  . Multiple Vitamin (MULTIVITAMIN WITH MINERALS) TABS tablet Take 1 tablet by mouth daily.    .  Omega-3 Fatty Acids (FISH OIL) 1000 MG CAPS Take 1,000 mg by mouth daily.    . quinapril (ACCUPRIL) 20 MG tablet Take 20 mg by mouth daily.  3  . tolterodine (DETROL) 2 MG tablet Take 2 mg by mouth 2 (two) times daily.  2    Home: Home Living Family/patient expects to be discharged to:: Private residence Living Arrangements: Spouse/significant other, Children (son in to help out.) Available Help at Discharge: Family (son staying with pt and husband presently) Type of Home: House Home Access: Stairs to enter Technical brewer of Steps: 1/5 Entrance Stairs-Rails: Right, Left Home Layout: One level  Functional History: Prior Function Level of Independence: Independent Comments: per pt she could dress and bathe herself and go to the bathroom herself Functional Status:  Mobility: Bed Mobility Overal bed mobility: Needs Assistance Bed Mobility: Sidelying to Sit, Sit to Sidelying Sidelying to sit: Min assist Sit to sidelying: Min assist General bed mobility comments: cues for technique, frequent redirection to task. Transfers Overall transfer level: Needs assistance Equipment used: Ambulation equipment used Transfers: Sit to/from Stand Sit to Stand: Mod assist General transfer comment: assist to come forward and stability assist      ADL:    Cognition: Cognition Overall Cognitive Status: Impaired/Different from baseline Orientation Level: Oriented to person, Oriented to place, Disoriented to time, Disoriented to situation Cognition Arousal/Alertness: Awake/alert Behavior During Therapy: WFL for tasks assessed/performed Overall Cognitive Status: Impaired/Different  from baseline Area of Impairment: Orientation, Following commands, Safety/judgement, Awareness, Problem solving, Memory, Attention Orientation Level: Time, Place, Situation Current Attention Level: Sustained Memory: Decreased short-term memory Following Commands: Follows one step commands with increased  time Safety/Judgement: Decreased awareness of safety, Decreased awareness of deficits Awareness: Intellectual Problem Solving: Slow processing  Blood pressure 137/76, pulse 71, temperature 98 F (36.7 C), temperature source Oral, resp. rate 22, height 5\' 6"  (1.676 m), weight 63.3 kg (139 lb 8.8 oz), SpO2 95 %. Physical Exam  Nursing note and vitals reviewed. Constitutional: She appears well-developed and well-nourished.  Appears younger than stated age.   HENT:  Head: Normocephalic and atraumatic.  Eyes: Conjunctivae and EOM are normal. Pupils are equal, round, and reactive to light.  Neck: Normal range of motion. Neck supple.  Cardiovascular: Normal rate and regular rhythm.   Respiratory: Effort normal and breath sounds normal. No respiratory distress. She has no wheezes.  GI: Soft. Bowel sounds are normal. She exhibits no distension. There is no tenderness.  Musculoskeletal: She exhibits no edema or tenderness.  Strength appears to be >/3/5 throughout  Neurological: She is alert.  Pleasant and appropriate but oriented to self only and was unable to reorient her.  Speech clear.   Able to follow simple one step commands Perseverates Confused Severe ataxia/dysmetria Unable to assess CN due to lack of cooperation  Skin: Skin is warm and dry. No rash noted. No erythema.  Psychiatric: Her behavior is normal. She exhibits abnormal recent memory and abnormal remote memory.    No results found for this or any previous visit (from the past 24 hour(s)). Ct Angio Head W/cm &/or Wo Cm  03/04/2015  CLINICAL DATA:  Right-sided facial droop. Hemorrhagic infarct yesterday. EXAM: CT ANGIOGRAPHY HEAD TECHNIQUE: Multidetector CT imaging of the head was performed using the standard protocol during bolus administration of intravenous contrast. Multiplanar CT image reconstructions and MIPs were obtained to evaluate the vascular anatomy. CONTRAST:  84mL OMNIPAQUE IOHEXOL 350 MG/ML SOLN COMPARISON:  CT  head without contrast 03/03/2015. FINDINGS: CT HEAD Brain: The area of focal hemorrhagic infarction in the left frontal centrum semi ovale again noted. No new infarct or expansion is evident. The basal ganglia are intact. Moderate atrophy and white matter disease is again noted. Calvarium and skull base: Within normal limits Paranasal sinuses: Clear Orbits: Bilateral lens replacements are noted. Globes and orbits are otherwise intact. CTA HEAD Anterior circulation: Atherosclerotic calcifications are present within the cavernous internal carotid arteries bilaterally. Just distal the calcification, the lumen of the right internal carotid artery is narrowed to 1.2 mm. This compares with a more distal measurement of 2.9 mm. Atherosclerotic calcifications are present in the left cavernous internal carotid artery without a significant stenosis. The ICA terminus is intact bilaterally. The A1 and M1 segments are within normal limits. There is mild narrowing of the distal right M1 segment. The A1 and segments bilaterally in the left M1 segment are within normal limits. There is asymmetric attenuation of anterior MCA branches on the right. A high-grade proximal M2 stenosis is present on the right. Left-sided MCA branches are intact. There is mild distal ACA branch vessel attenuation. Posterior circulation: Atherosclerotic calcifications are present at the dural margin of the vertebral arteries bilaterally. The right vertebral artery is dominant. The right AICA is dominant. The basilar artery is normal. Both posterior cerebral arteries originate from the basilar tip. There moderate proximal stenoses with attenuation of the distal vessels. Venous sinuses: The dural sinuses are patent. The right transverse  sinus is dominant. Anatomic variants: None Delayed phase:The postcontrast images demonstrate no pathologic enhancement. There is no mass lesion or vascular anomaly associated with the area focal hemorrhage. IMPRESSION: 1.  Stable focus of hemorrhage within the left centrum semi ovale. 2. Stable atrophy and white matter disease without expansion of the infarct or new infarcts. 3. High-grade stenosis in the anterior right M2 division. 4. At least 60% stenosis within the cavernous right internal carotid artery. The lumen measurement is distal calcification in the stenosis within the calcified segment may be greater than this. 5. Asymmetric attenuation of anterior right MCA branches. 6. Moderate proximal PCA stenoses bilaterally. Electronically Signed   By: San Morelle M.D.   On: 03/04/2015 14:38   Ct Head Wo Contrast  03/03/2015  CLINICAL DATA:  Right-sided facial droop, slurred speech. EXAM: CT HEAD WITHOUT CONTRAST TECHNIQUE: Contiguous axial images were obtained from the base of the skull through the vertex without intravenous contrast. COMPARISON:  None. FINDINGS: There is a small area of high attenuation in the left corona radiata, measuring approximately 5 x 8 mm (series 2, image 17). No definitive evidence of an acute infarct, mass lesion, mass effect or hydrocephalus. Atrophy. Confluent low-attenuation in the periventricular deep white matter. Visualized portions of the paranasal sinuses and mastoid air cells are clear. IMPRESSION: 1. Focal high attenuation in the left corona radiata is worrisome for acute hemorrhage. Calcification is considered less likely. Critical Value/emergent results were called by telephone at the time of interpretation on 03/03/2015 at 5:47 pm to Dr. Lonia Skinner , who verbally acknowledged these results. 2. Atrophy and chronic microvascular white matter ischemic changes. Electronically Signed   By: Lorin Picket M.D.   On: 03/03/2015 17:48    Assessment/Plan: Diagnosis: Small hemorrhage in left corona radiata Labs and images independently reviewed.  Records reviewed and summated above. Stroke: Continue secondary stroke prophylaxis and Risk Factor Modification listed below:   Blood  Pressure Management:  Continue current medications PT/OT for mobility, ADL training  1. Does the need for close, 24 hr/day medical supervision in concert with the patient's rehab needs make it unreasonable for this patient to be served in a less intensive setting? Potentially 2. Co-Morbidities requiring supervision/potential complications: HTN (monitor and provide prns in accordance with increased physical exertion and pain), dementia 3. Due to bladder management, safety, disease management, medication administration, pain management and patient education, does the patient require 24 hr/day rehab nursing? Yes 4. Does the patient require coordinated care of a physician, rehab nurse, PT (1-2 hrs/day, 6 days/week), OT (-2 hrs/day, 5 days/week) and SLP (1-2 hrs/day, 5 days/week) to address physical and functional deficits in the context of the above medical diagnosis(es)? Potentially Addressing deficits in the following areas: balance, endurance, locomotion, strength, transferring, bowel/bladder control, bathing, dressing, feeding, grooming, toileting, cognition, language, swallowing and psychosocial support 5. Can the patient actively participate in an intensive therapy program of at least 3 hrs of therapy per day at least 5 days per week? Potentially 6. The potential for patient to make measurable gains while on inpatient rehab is good, fair and poor 7. Anticipated functional outcomes upon discharge from inpatient rehab are min assist  with PT, supervision and min assist with OT, supervision with SLP. 8. Estimated rehab length of stay to reach the above functional goals is: 16-20 days. 9. Does the patient have adequate social supports and living environment to accommodate these discharge functional goals? Potentially 10. Anticipated D/C setting: Home 11. Anticipated post D/C treatments: HH therapy and  Home excercise program 12. Overall Rehab/Functional Prognosis: good  RECOMMENDATIONS: This patient's  condition is appropriate for continued rehabilitative care in the following setting: CIR assuming family support at discharge Patient has agreed to participate in recommended program. Potentially Note that insurance prior authorization may be required for reimbursement for recommended care.  Comment: Rehab Admissions Coordinator to follow up on pt's support after discharge.  Delice Lesch, MD 03/05/2015

## 2015-03-06 NOTE — Progress Notes (Signed)
STROKE TEAM PROGRESS NOTE   SUBJECTIVE (INTERVAL HISTORY) No family members present. The patient is pleasantly confused. She is able to follow commands.   OBJECTIVE Temp:  [97.4 F (36.3 C)-98.4 F (36.9 C)] 98.1 F (36.7 C) (10/22 0937) Pulse Rate:  [70-84] 70 (10/22 0937) Cardiac Rhythm:  [-] Normal sinus rhythm (10/21 1915) Resp:  [16-24] 16 (10/22 0937) BP: (117-157)/(46-97) 157/54 mmHg (10/22 0937) SpO2:  [92 %-100 %] 98 % (10/22 0937)  CBC:   Recent Labs Lab 03/03/15 1733 03/03/15 1741  WBC 9.2  --   NEUTROABS 5.4  --   HGB 12.3 13.6  HCT 35.3* 40.0  MCV 80.6  --   PLT 160  --     Basic Metabolic Panel:   Recent Labs Lab 03/03/15 1733 03/03/15 1741  NA 137 138  K 4.3 4.3  CL 106 106  CO2 23  --   GLUCOSE 152* 155*  BUN 21* 24*  CREATININE 1.06* 1.10*  CALCIUM 10.2  --     Lipid Panel: No results found for: CHOL, TRIG, HDL, CHOLHDL, VLDL, LDLCALC HgbA1c: No results found for: HGBA1C Urine Drug Screen: No results found for: LABOPIA, COCAINSCRNUR, LABBENZ, AMPHETMU, THCU, LABBARB    IMAGING  Ct Head Wo Contrast 03/03/2015    1. Focal high attenuation in the left corona radiata is worrisome for acute hemorrhage. Calcification is considered less likely.   CT angio Head  03/04/2015 1. Stable focus of hemorrhage within the left centrum semi ovale. 2. Stable atrophy and white matter disease without expansion of the infarct or new infarcts. 3. High-grade stenosis in the anterior right M2 division. 4. At least 60% stenosis within the cavernous right internal carotid artery. The lumen measurement is distal calcification in the stenosis within the calcified segment may be greater than this. 5. Asymmetric attenuation of anterior right MCA branches. 6. Moderate proximal PCA stenoses bilaterally.   PHYSICAL EXAM Frail petite elderly African-American lady not in distress. . Afebrile. Head is nontraumatic. Neck is supple without bruit. Some radiation of heart  murmur to carotids bilaterally.    Cardiac exam - 2/6 systolic murmur. Lungs are clear to auscultation. Distal pulses are well felt. Neurological Exam :  Awake alert oriented to person only. Diminished attention, registration and recall. Speech is fluent without dysarthria or aphasia. Extraocular movements are full range without nystagmus. Fundi were not visualized. Vision acuity seems adequate. Visual fields seem full to bedside confrontational testing. Right lower facial mild weakness. Tongue midline. Motor system exam no upper or lower extremity drift. Symmetric and equal strength in all 4 extremities. No focal weakness. Touch and sensation is intact bilaterally. Coordination slow but accurate. Gait was not tested.   ASSESSMENT/PLAN Christine Kramer is a 81 y.o. female with history of dementia, hypertension presenting with facial droop. CT showed L corona radiata hemorrhage.   Stroke:   left corona radiata hemorrhage most likely secondary to  acclerated hypertension  Resultant  Facial droop improved  CTA head   Asymptomatic R stenosis. No correctable large vessel stenosis   SCDs for VTE prophylaxis Diet regular Room service appropriate?: Yes; Fluid consistency:: Thin  No antithrombotic prior to admission   Ongoing aggressive stroke risk factor management  transfer to floor  Therapy recommendations:  CIR consult completed. CIR candidate assuming family support at discharge.  Disposition:  pending (lives alone, husband in NH.) Son is present at home.  Accelerated Hypertension  BP on arrival 258/52, 778/24 - systolic blood pressure now 140s to  150s.  Stable currently with BP 142/64  SBP goal < 160  Add low dose norvasc  Hyperlipidemia  Home meds:  Fish oil  Continue statin at discharge  Other Stroke Risk Factors  Advanced age  Other Active Problems  Baseline dementia  Hospital day # [place] for Pager  information 03/06/2015 11:53 AM   Lenor Coffin Phone [phone]      To contact Stroke Continuity provider, please refer to [email or web address] After hours, contact General Neurology

## 2015-03-07 NOTE — Progress Notes (Signed)
STROKE TEAM PROGRESS NOTE   SUBJECTIVE (INTERVAL HISTORY) No family members present. The patient reports this is somewhat depressed today because her son did not come to visit her. Her husband is currently in a nursing home. She is oriented to person and place only.   OBJECTIVE Temp:  [97.4 F (36.3 C)-98.4 F (36.9 C)] 97.4 F (36.3 C) (10/23 1007) Pulse Rate:  [55-84] 84 (10/23 1007) Cardiac Rhythm:  [-]  Resp:  [16-20] 20 (10/23 1007) BP: (113-187)/(51-96) 150/60 mmHg (10/23 1007) SpO2:  [96 %-99 %] 98 % (10/23 1007)  CBC:   Recent Labs Lab 03/03/15 1733 03/03/15 1741  WBC 9.2  --   NEUTROABS 5.4  --   HGB 12.3 13.6  HCT 35.3* 40.0  MCV 80.6  --   PLT 160  --     Basic Metabolic Panel:   Recent Labs Lab 03/03/15 1733 03/03/15 1741  NA 137 138  K 4.3 4.3  CL 106 106  CO2 23  --   GLUCOSE 152* 155*  BUN 21* 24*  CREATININE 1.06* 1.10*  CALCIUM 10.2  --     Lipid Panel: No results found for: CHOL, TRIG, HDL, CHOLHDL, VLDL, LDLCALC HgbA1c: No results found for: HGBA1C Urine Drug Screen: No results found for: LABOPIA, COCAINSCRNUR, LABBENZ, AMPHETMU, THCU, LABBARB    IMAGING  Ct Head Wo Contrast 03/03/2015    1. Focal high attenuation in the left corona radiata is worrisome for acute hemorrhage. Calcification is considered less likely.   CT angio Head  03/04/2015 1. Stable focus of hemorrhage within the left centrum semi ovale. 2. Stable atrophy and white matter disease without expansion of the infarct or new infarcts. 3. High-grade stenosis in the anterior right M2 division. 4. At least 60% stenosis within the cavernous right internal carotid artery. The lumen measurement is distal calcification in the stenosis within the calcified segment may be greater than this. 5. Asymmetric attenuation of anterior right MCA branches. 6. Moderate proximal PCA stenoses bilaterally.   PHYSICAL EXAM Frail petite elderly African-American lady not in distress. .  Afebrile. Head is nontraumatic. Neck is supple without bruit. Some radiation of heart murmur to carotids bilaterally.    Cardiac exam - 2/6 systolic murmur. Lungs are clear to auscultation. Distal pulses are well felt. Neurological Exam :  Awake alert oriented to person only. Diminished attention, registration and recall. Speech is fluent without dysarthria or aphasia. Extraocular movements are full range without nystagmus. Fundi were not visualized. Vision acuity seems adequate. Visual fields seem full to bedside confrontational testing. Right lower facial mild weakness. Tongue midline. Motor system exam no upper or lower extremity drift. Symmetric and equal strength in all 4 extremities. No focal weakness. Touch and sensation is intact bilaterally. Coordination slow but accurate. Gait was not tested.   ASSESSMENT/PLAN Ms. CRISSIE ALOI is a 81 y.o. female with history of dementia, hypertension presenting with facial droop. CT showed L corona radiata hemorrhage.   Stroke:   left corona radiata hemorrhage most likely secondary to  acclerated hypertension  Resultant  Facial droop improved  CTA head   Asymptomatic R stenosis. No correctable large vessel stenosis   SCDs for VTE prophylaxis Diet regular Room service appropriate?: Yes; Fluid consistency:: Thin  No antithrombotic prior to admission   Ongoing aggressive stroke risk factor management  transfer to floor  Therapy recommendations:  CIR consult completed. CIR candidate assuming family support at discharge.  Disposition:  pending (lives alone, husband in NH.) Son is present  at home.  Accelerated Hypertension  BP on arrival 536/64, 403/47 - systolic blood pressure now 140s to 150s.  Stable currently with BP 142/64  SBP goal < 160  Add low dose norvasc  Hyperlipidemia  Home meds:  Fish oil  Continue statin at discharge  Other Stroke Risk Factors  Advanced age  Other Active Problems  Baseline  dementia  PLAN  Check labs in a.m.  Hospital day # [place] for Pager information 03/07/2015 12:32 PM    Lenor Coffin      To contact Stroke Continuity provider, please refer to [email or web address] After hours, contact General Neurology

## 2015-03-08 LAB — CBC
HCT: 34.3 % — ABNORMAL LOW (ref 36.0–46.0)
Hemoglobin: 12 g/dL (ref 12.0–15.0)
MCH: 27.8 pg (ref 26.0–34.0)
MCHC: 35 g/dL (ref 30.0–36.0)
MCV: 79.4 fL (ref 78.0–100.0)
Platelets: 141 10*3/uL — ABNORMAL LOW (ref 150–400)
RBC: 4.32 MIL/uL (ref 3.87–5.11)
RDW: 13.8 % (ref 11.5–15.5)
WBC: 8.4 10*3/uL (ref 4.0–10.5)

## 2015-03-08 LAB — BASIC METABOLIC PANEL
Anion gap: 9 (ref 5–15)
BUN: 20 mg/dL (ref 6–20)
CALCIUM: 9.9 mg/dL (ref 8.9–10.3)
CO2: 24 mmol/L (ref 22–32)
Chloride: 104 mmol/L (ref 101–111)
Creatinine, Ser: 0.85 mg/dL (ref 0.44–1.00)
GFR calc Af Amer: >60 mL/min (ref >60)
GFR, EST NON AFRICAN AMERICAN: 58 mL/min — AB (ref >60)
GLUCOSE: 133 mg/dL — AB (ref 65–99)
Potassium: 4 mmol/L (ref 3.5–5.1)
Sodium: 137 mmol/L (ref 135–145)

## 2015-03-08 NOTE — Progress Notes (Signed)
Occupational Therapy Treatment Patient Details Name: Christine Kramer MRN: [id number] DOB: [birth year] Today's Date: 03/08/2015    History of present illness Christine Kramer is a 81 y.o. female with a history of dementia, hypertension who presents with facial droop that started earlier today.  CT showed a small hemorrhage in the left corona radiata.   OT comments  Pt. Making gains with skilled OT but continues to require max verbal and tactile guidance for safety and sequencing during functional tasks.  Mod a for stand pivot transfer with multiple attempts to transition into standing due to significant post. Lean.   Agree with initial recommendation that pt. Will benefit from continued intense level therapies to further address these deficits and facilitate with increasing pts. Strength and safety during ADLS.    Follow Up Recommendations  CIR;Supervision/Assistance - 24 hour    Equipment Recommendations       Recommendations for Other Services      Precautions / Restrictions Precautions Precautions: Fall       Mobility Bed Mobility Overal bed mobility: Needs Assistance Bed Mobility: Rolling;Sidelying to Sit Rolling: Min assist Sidelying to sit: Min assist       General bed mobility comments: hob elevated, tactile guidance to bring ues around and reach for bed rails, max demonstrational cues to scoot hips towards eob and bear weight through b les  Transfers Overall transfer level: Needs assistance Equipment used: Rolling walker (2 wheeled) Transfers: Sit to/from [employer] Sit to Stand: Mod assist   Squat pivot transfers: Mod assist     General transfer comment: assist to rise and gain balance, uses momentum, needed multiple trials, pushing back on heels and back of legs on bed, unable to push through feet c/o slipping but not able to correct even with tactile guidance.  states "i just dont have confidence, its my ankles i think".  discussed with pt. and  sisters who were present to bring in pts. lace up shoes she usually wears to offer support during mobility    Balance   Sitting-balance support: Single extremity supported;Feet supported Sitting balance-Leahy Scale: Fair       Standing balance-Leahy Scale: Poor Standing balance comment: significant post lean, tops of feet and toes in the air, pushing b les into bed while trying to stand                   ADL Overall ADL's : Needs assistance/impaired                         Toilet Transfer: Moderate assistance;Squat-pivot;RW   Toileting- Clothing Manipulation and Hygiene: Maximal assistance;Sitting/lateral lean;Sit to/from stand Toileting - Clothing Manipulation Details (indicate cue type and reason): simulated     Functional mobility during ADLs: Moderate assistance;Rolling walker General ADL Comments: pt. continued to say " i just dont have any confidence, i feel weak in my ankles".  max cues and encouragement for completion of tasks, notable difficulty focusing and following one step commands with demonstrational cues      Vision                     Perception     Praxis      Cognition   Behavior During Therapy: [facility] for tasks assessed/performed   Area of Impairment: Orientation;Following commands;Safety/judgement;Awareness;Problem solving;Memory;Attention   Current Attention Level: Selective Memory: Decreased short-term memory  Following Commands: Follows one step commands with increased time;Follows one step commands inconsistently  Awareness: Intellectual Problem Solving: Slow processing;Difficulty sequencing;Requires verbal cues;Requires tactile cues      Extremity/Trunk Assessment               Exercises     Shoulder Instructions       General Comments      Pertinent Vitals/ Pain       Pain Assessment: No/denies pain  Home Living                                          Prior  Functioning/Environment              Frequency Min 2X/week     Progress Toward Goals  OT Goals(current goals can now be found in the care plan section)  Progress towards OT goals: Progressing toward goals     Plan Discharge plan remains appropriate    Co-evaluation                 End of Session Equipment Utilized During Treatment: Gait belt;Rolling walker   Activity Tolerance Patient tolerated treatment well   Patient Left in chair;with call bell/phone within reach;with chair alarm set;with family/visitor present   Nurse Communication Other (comment) (nofified cna pt. up in chair with chair alarm and family present, reviewed rec. for best way to assist pt. back to bed later)        Time: 1610-9604 OT Time Calculation (min): 20 min  Charges: OT General Charges $OT Visit: 1 Procedure OT Treatments $Self Care/Home Management : 8-22 mins  Janice Coffin, COTA/L 03/08/2015, 3:54 PM

## 2015-03-08 NOTE — Care Management Important Message (Signed)
Important Message  Patient Details  Name: Christine Kramer MRN: [id number] Date of Birth: 1921/01/21   Medicare Important Message Given:  Yes-second notification given    Delorse Lek 03/08/2015, 12:20 PM

## 2015-03-08 NOTE — Progress Notes (Signed)
Rehab admissions - Evaluated for possible admission.  I met with patient.  She is somewhat confused.  I will contact her son to discuss rehab options.  Call me for questions.  #[phone]

## 2015-03-08 NOTE — Progress Notes (Signed)
STROKE TEAM PROGRESS NOTE   SUBJECTIVE (INTERVAL HISTORY) No family members present. No new complaints.   OBJECTIVE Temp:  [97 F (36.1 C)-98.2 F (36.8 C)] 97.7 F (36.5 C) (10/24 1405) Pulse Rate:  [50-76] 50 (10/24 1405) Cardiac Rhythm:  [-]  Resp:  [16-20] 18 (10/24 1405) BP: (124-166)/(43-89) 124/43 mmHg (10/24 1405) SpO2:  [95 %-98 %] 98 % (10/24 1405)  CBC:   Recent Labs Lab 03/03/15 1733 03/03/15 1741 03/08/15 0455  WBC 9.2  --  8.4  NEUTROABS 5.4  --   --   HGB 12.3 13.6 12.0  HCT 35.3* 40.0 34.3*  MCV 80.6  --  79.4  PLT 160  --  141*    Basic Metabolic Panel:   Recent Labs Lab 03/03/15 1733 03/03/15 1741 03/08/15 0455  NA 137 138 137  K 4.3 4.3 4.0  CL 106 106 104  CO2 23  --  24  GLUCOSE 152* 155* 133*  BUN 21* 24* 20  CREATININE 1.06* 1.10* 0.85  CALCIUM 10.2  --  9.9    PHYSICAL EXAM Frail petite elderly African-American lady not in distress. . Afebrile. Head is nontraumatic. Neck is supple without bruit. Some radiation of heart murmur to carotids bilaterally.    Cardiac exam - 2/6 systolic murmur. Lungs are clear to auscultation. Distal pulses are well felt. Neurological Exam :  Awake alert oriented to person only. Diminished attention, registration and recall. Speech is fluent without dysarthria or aphasia. Extraocular movements are full range without nystagmus. Fundi were not visualized. Vision acuity seems adequate. Visual fields seem full to bedside confrontational testing. Right lower facial mild weakness. Tongue midline. Motor system exam no upper or lower extremity drift. Symmetric and equal strength in all 4 extremities. No focal weakness. Touch and sensation is intact bilaterally. Coordination slow but accurate. Gait was not tested.   ASSESSMENT/PLAN Christine Kramer is a 81 y.o. female with history of dementia, hypertension presenting with facial droop. CT showed L corona radiata hemorrhage.   Stroke:   left corona radiata hemorrhage  most likely secondary to  acclerated hypertension  Resultant  Facial droop improved  CTA head   Asymptomatic R stenosis. No correctable large vessel stenosis   SCDs for VTE prophylaxis Diet regular Room service appropriate?: Yes; Fluid consistency:: Thin  No antithrombotic prior to admission   Ongoing aggressive stroke risk factor management  transfer to floor  Therapy recommendations:  CIR consult completed. CIR candidate assuming family support at discharge. Rehab meeting with her son today  Disposition:  pending (lives alone, [place] in [place].) Son is present at home. Await CIR bed vs SNF decision.  Accelerated Hypertension  BP on arrival 220/25, 427/06 - systolic blood pressure now 140s to 150s.  At SBP goal < 160  Added low dose norvasc during hospitalization  Hyperlipidemia  Home meds:  Fish oil  Continue statin at discharge  Other Stroke Risk Factors  Advanced age  Other Active Problems  Baseline dementia  labs stable    Hospital day # [place] for Pager information 03/08/2015 4:37 PM  I have personally examined this patient, reviewed notes, independently viewed imaging studies, participated in medical decision making and plan of care. I have made any additions or clarifications directly to the above note. Agree with note above.   Antony Contras, MD Medical Director [place] Pager: [phone] 03/08/2015 5:04 PM   To contact Stroke Continuity provider, please refer to  [email or web address] After hours, contact General Neurology

## 2015-03-08 NOTE — Clinical Social Work Placement (Signed)
   CLINICAL SOCIAL WORK PLACEMENT  NOTE  Date:  03/08/2015  Patient Details  Name: LAYLONIE MARZEC MRN: [id number] Date of Birth: [birth year]  Clinical Social Work is seeking post-discharge placement for this patient at the [place] level of care (*CSW will initial, date and re-position this form in  chart as items are completed):  Yes   Patient/family provided with [place] Work Department's list of facilities offering this level of care within the geographic area requested by the patient (or if unable, by the patient's family).  Yes   Patient/family informed of their freedom to choose among providers that offer the needed level of care, that participate in Medicare, Medicaid or managed care program needed by the patient, have an available bed and are willing to accept the patient.  Yes   Patient/family informed of [facility]'s ownership interest in [facility] and [facility], as well as of the fact that they are under no obligation to receive care at these facilities.  PASRR submitted to EDS on 03/08/15     PASRR number received on       Existing PASRR number confirmed on       FL2 transmitted to all facilities in geographic area requested by pt/family on 03/08/15     FL2 transmitted to all facilities within larger geographic area on       Patient informed that his/her managed care company has contracts with or will negotiate with certain facilities, including the following:            Patient/family informed of bed offers received.  Patient chooses bed at       Physician recommends and patient chooses bed at      Patient to be transferred to   on  .  Patient to be transferred to facility by       Patient family notified on   of transfer.  Name of family member notified:        PHYSICIAN Please sign FL2     Additional Comment:   Barbette Or, [place]

## 2015-03-08 NOTE — Clinical Social Work Note (Signed)
Clinical Social Work Assessment  Patient Details  Name: Christine Kramer MRN: [id number] Date of Birth: [birth year]  Date of referral:  03/08/15               Reason for consult:  Facility Placement                Permission sought to share information with:  Family Supports Permission granted to share information::  Yes, Verbal Permission Granted  Name::     Christine Kramer  Relationship::  Son  Contact Information:  [phone]  Housing/Transportation Living arrangements for the past 2 months:  Apartment Source of Information:  Adult Children Patient Interpreter Needed:  None Criminal Activity/Legal Involvement Pertinent to Current Situation/Hospitalization:  No - Comment as needed Significant Relationships:  Adult Children, Siblings Lives with:  Adult Children Do you feel safe going back to the place where you live?  Yes Need for family participation in patient care:  Yes (Comment)  Care giving concerns:  CSW spoke with patient son over the phone who expresses concerns regarding patient ability to return home to [place] vs. Remaining local.  Patient husband is currently in SNF in [place], however patient son is requesting local placement at this time.   Social Worker assessment / plan:  Holiday representative spoke with patient son over the phone to offer support and discuss patient plans at discharge.  Patient son states that patient came to the [facility] area to stay with him the Saturday prior to hospitalizaion.  Patient husband is currently in a SNF in [place] with hopes of returning home.  Patient son states that he would prefer patient to remain in the [place] area while participating in rehab.  Patient son hopeful for inpatient rehab placement, however remains agreeable to SNF search in [facility].  CSW initiated referral to [facility] and will follow up with bed offers once available.  CSW remains available for support and to facilitate patient discharge  needs once medically stable.  Employment status:  Retired PT Recommendations:  Inpatient Rehab Consult Information / Referral to community resources:  [place]  Patient/Family's Response to care:  Patient son verbalized understanding and appreciation for CSW role and support.  Patient son agreeable with rehab needs post discharge.  Patient son did state that patient has several sisters in the area who are also available to provide necessary support.  Patient/Family's Understanding of and Emotional Response to Diagnosis, Current Treatment, and Prognosis:  Patient son with limited understanding regarding patient long term plans, however realistic in regards to patient potential long term needs for placement.  Patient family hopeful that patient can eventually return to [place].  Emotional Assessment Appearance:  Appears stated age Attitude/Demeanor/Rapport:  Unable to Assess Affect (typically observed):  Unable to Assess Orientation:  Oriented to Self Alcohol / Substance use:  Not Applicable Psych involvement (Current and /or in the community):  No (Comment)  Discharge Needs  Concerns to be addressed:  Care Coordination Readmission within the last 30 days:  No Current discharge risk:  Cognitively Impaired, Lack of support system Barriers to Discharge:  Continued Medical Work up, Ship broker, Programmer, applications ([place])  Barbette Or, [place]

## 2015-03-09 DIAGNOSIS — F0390 Unspecified dementia without behavioral disturbance: Secondary | ICD-10-CM

## 2015-03-09 DIAGNOSIS — I10 Essential (primary) hypertension: Secondary | ICD-10-CM

## 2015-03-09 MED ORDER — AMLODIPINE BESYLATE 5 MG PO TABS
5.0000 mg | ORAL_TABLET | Freq: Every day | ORAL | Status: DC
  Administered 2015-03-09 – 2015-03-11 (×3): 5 mg via ORAL
  Filled 2015-03-09 (×3): qty 1

## 2015-03-09 NOTE — Progress Notes (Signed)
STROKE TEAM PROGRESS NOTE   SUBJECTIVE (INTERVAL HISTORY) Patient up in chair. Upset she is from [place], in [place] and concerned she does not have enough money. Wants her sisters Astrid Drafts and Enis Gash called. She does not know their phone numbers. Said her son had no idea she was sick.  Remains confused. More talkative than she has been.   OBJECTIVE Temp:  [97.5 F (36.4 C)-98.3 F (36.8 C)] 98.3 F (36.8 C) (10/25 0504) Pulse Rate:  [43-76] 68 (10/25 0504) Cardiac Rhythm:  [-]  Resp:  [18-20] 18 (10/25 0504) BP: (124-154)/(40-89) 138/51 mmHg (10/25 0504) SpO2:  [96 %-98 %] 98 % (10/25 0504)  CBC:   Recent Labs Lab 03/03/15 1733 03/03/15 1741 03/08/15 0455  WBC 9.2  --  8.4  NEUTROABS 5.4  --   --   HGB 12.3 13.6 12.0  HCT 35.3* 40.0 34.3*  MCV 80.6  --  79.4  PLT 160  --  141*    Basic Metabolic Panel:   Recent Labs Lab 03/03/15 1733 03/03/15 1741 03/08/15 0455  NA 137 138 137  K 4.3 4.3 4.0  CL 106 106 104  CO2 23  --  24  GLUCOSE 152* 155* 133*  BUN 21* 24* 20  CREATININE 1.06* 1.10* 0.85  CALCIUM 10.2  --  9.9    PHYSICAL EXAM Frail petite elderly African-American lady not in distress. . Afebrile. Head is nontraumatic. Neck is supple without bruit. Some radiation of heart murmur to carotids bilaterally.    Cardiac exam - 2/6 systolic murmur. Lungs are clear to auscultation. Distal pulses are well felt. Neurological Exam :  Awake alert oriented to person only. Diminished attention, registration and recall. Speech is fluent without dysarthria or aphasia. Extraocular movements are full range without nystagmus. Fundi were not visualized. Vision acuity seems adequate. Visual fields seem full to bedside confrontational testing. Right lower facial mild weakness. Tongue midline. Motor system exam no upper or lower extremity drift. Symmetric and equal strength in all 4 extremities. No focal weakness. Touch and sensation is intact bilaterally. Coordination slow  but accurate. Gait was not tested.   ASSESSMENT/PLAN [name] is a 81 y.o. female with history of dementia, hypertension presenting with facial droop. CT showed L corona radiata hemorrhage.   Stroke:   left corona radiata small hemorrhage most likely secondary to  acclerated hypertension  Resultant  Facial droop improved  CTA head   Asymptomatic R stenosis. No correctable large vessel stenosis   SCDs for VTE prophylaxis Diet regular Room service appropriate?: Yes; Fluid consistency:: Thin  No antithrombotic prior to admission   Ongoing aggressive stroke risk factor management  transfer to floor  Therapy recommendations:  CIR consult completed. CIR candidate assuming family support at discharge. Rehab meeting with her son today- rehab met with sisters. They are unable to provide care for pt at discharge. All agreeable to SNF placement  Disposition:  pending (lives alone, husband in [place] in [place], pt currently visiting son in [place]) SNF when bed available.  Accelerated Hypertension  BP on arrival 199/52, 200/84 - normalizing systolic blood pressure now 120s to 140s.  At SBP goal < 160  Added low dose norvasc 69m during hospitalization  Also on lisinopril to 232m  Hyperlipidemia  Home meds:  Fish oil  Continue Fish oil at discharge  Other Stroke Risk Factors  Advanced age  Other Active Problems  Baseline dementia   Hospital day # 6  BIRonaldo [place]roke  [place] for Pager information 03/09/2015 9:18 AM   I, the attending vascular neurologist, have personally obtained a history, examined the patient, evaluated laboratory data, individually viewed imaging studies and agree with radiology interpretations. Together with the NP/PA, we formulated the assessment and plan of care which reflects our mutual decision.  I have made any additions or clarifications directly to the above note and agree with the findings and plan as currently documented.    Rosalin Hawking, MD PhD Stroke Neurology 03/09/2015 10:03 PM      To contact Stroke Continuity provider, please refer to [email or web address] After hours, contact General Neurology

## 2015-03-09 NOTE — Progress Notes (Signed)
Rehab admissions - I met with two sisters at the bedside.  They are in agreement to SNF placement rather than inpatient rehab.  No one available to stay with patient at home after a potential inpatient rehab stay.  Agree with pursuit of SNF placement at this time.  Call me for questions.  #[phone]

## 2015-03-09 NOTE — Progress Notes (Signed)
Physical Therapy Treatment Patient Details Name: Christine Kramer MRN: [id number] DOB: February 04, 1921 Today's Date: 03/09/2015    History of Present Illness Christine Kramer is a 81 y.o. female with a history of dementia, hypertension who presents with facial droop that started earlier today.  CT showed a small hemorrhage in the left corona radiata.    PT Comments    Patient very anxious re: possible fall (and eventually said her sisters were making her nervous--they stepped out for remainder of session). Multiple transfers repeated for sequencing and strengthening.  Follow Up Recommendations  SNF     Equipment Recommendations  None recommended by PT    Recommendations for Other Services       Precautions / Restrictions Precautions Precautions: Fall Restrictions Weight Bearing Restrictions: No    Mobility  Bed Mobility                  Transfers Overall transfer level: Needs assistance Equipment used: Rolling walker (2 wheeled) Transfers: Sit to/from [employer] Transfers Sit to Stand: Mod assist Stand pivot transfers: Mod assist       General transfer comment: vc for sequencing each time (she would recall different parts each time, but never all the steps)  Ambulation/Gait             General Gait Details: pt too anxious and with strong posterior lean   Stairs            Wheelchair Mobility    Modified Rankin (Stroke Patients Only) Modified Rankin (Stroke Patients Only) Pre-Morbid Rankin Score: No significant disability Modified Rankin: Moderately severe disability     Balance     Sitting balance-Leahy Scale: Fair     Standing balance support: Bilateral upper extremity supported Standing balance-Leahy Scale: Zero Standing balance comment: poor at times; strong posterior lean                    Cognition Arousal/Alertness: Awake/alert Behavior During Therapy: WFL for tasks assessed/performed Overall Cognitive Status:  Impaired/Different from baseline Area of Impairment: Orientation;Following commands;Safety/judgement;Awareness;Problem solving;Memory;Attention Orientation Level: Time;Situation Current Attention Level: Sustained (distracted when sisters in room) Memory: Decreased short-term memory Following Commands: Follows one step commands consistently Safety/Judgement: Decreased awareness of safety;Decreased awareness of deficits Awareness: Intellectual Problem Solving: Slow processing;Difficulty sequencing;Requires verbal cues;Requires tactile cues      Exercises Other Exercises Other Exercises: sit to stand x 8 tries (successful x 6)    General Comments        Pertinent Vitals/Pain Pain Assessment: No/denies pain    Home Living                      Prior Function            PT Goals (current goals can now be found in the care plan section) Acute Rehab PT Goals Patient Stated Goal: pt wants to get back home PT Goal Formulation: With patient Time For Goal Achievement: 03/18/15 Potential to Achieve Goals: Good Progress towards PT goals: Progressing toward goals    Frequency  Min 3X/week    PT Plan Discharge plan needs to be updated    Co-evaluation             End of Session Equipment Utilized During Treatment: Gait belt Activity Tolerance: Patient tolerated treatment well Patient left: in chair;with call bell/phone within reach;with chair alarm set;with family/visitor present     Time: 0388-8280 PT Time Calculation (min) (ACUTE ONLY): 34 min  Charges:  $  Therapeutic Activity: 23-37 mins                    G Codes:      Christine Kramer 03-28-2015, 4:25 PM  Pager [phone]  .p

## 2015-03-09 NOTE — Progress Notes (Addendum)
Speech Language Pathology Treatment: Dysphagia;Cognitive-Linquistic  Patient Details Name: Christine Kramer MRN: [id number] DOB: [birth year] Today's Date: 03/09/2015 Time: 3419-6222 SLP Time Calculation (min) (ACUTE ONLY): 18 min  Assessment / Plan / Recommendation Clinical Impression  Pt sitting upright in bed complaining of being cold.  SLP adjusted temperature in room to warm room.  Pt states she lives in [place] and was here visiting with her son.  She denies having seen an MD or being informed of possible CVA.  Pt correctly stated her home address in [place].  Review of basic functional tasks including use of call bell for assist reviewed - but pt required total cues to recall fall risk. SLP provided written signs in room to remind pt of need to call and she referred to it x2.   Decreased awareness to fall risk/weakness noted.   Pt observed consuming thin liquids without difficulties.  She had finished breakfast recently and has no oral pocketing. Recommend continue po diet with general precautions.   Given pt with baseline dementia, uncertain if she is demonstrating cognitive changes from this acute event and no family present to review.  Will establish baseline with family when present during tx.   Pt will benefit from 24/7 supervision upon DC due to cognitive deficits.     HPI Other Pertinent Information: BEXLEY LAUBACH is a 81 y.o. female with a history of dementia, hypertension who presents with facial droop. CT showed a small hemorrhage in the left corona radiata. Patient initially passed RN stroke swallow screen however coughing noted by nursing when transferred to ICU from ED. SLP swallow evaluation ordered.    Pertinent Vitals  afebrile, decreased  SLP Plan  Continue with current plan of care;Other (Comment) (swallow goals met)    Recommendations Diet recommendations: Thin liquid;Regular Liquids provided via: Straw Medication Administration: Whole meds with liquid Supervision:  Patient able to self feed              General recommendations: Rehab consult Oral Care Recommendations: Oral care BID Follow up Recommendations: Inpatient Rehab Plan: Continue with current plan of care;Other (Comment) (swallow goals met)    [place], [facility] SLP [phone]

## 2015-03-09 NOTE — Clinical Social Work Note (Addendum)
1:55pm- CSW spoke with patient's son and aunt Vira Agar) who state they would rather patient go to [facility] at time of discharge.  CSW contacted admission's coordinator to confirm bed availability.  CSW awaiting a response.  CSW will update [facility] once Miquel Dunn confirms bed availability.  10:44am- CSW spoke with Ronelle Nigh, son, who states that he is working and will be able to return calls intermittently (breaks/lunch).  CSW reviewed bed offers with son who has chosen [facility] and requested a private room.  CSW has confirmed bed availability at [facility].  RNCM updated.    Disposition: Changed to [place] (1:57pm)  Nonnie Done, LCSW [phone]  [place] Social Worker

## 2015-03-10 DIAGNOSIS — F0390 Unspecified dementia without behavioral disturbance: Secondary | ICD-10-CM | POA: Diagnosis present

## 2015-03-10 DIAGNOSIS — I161 Hypertensive emergency: Secondary | ICD-10-CM | POA: Diagnosis present

## 2015-03-10 DIAGNOSIS — E785 Hyperlipidemia, unspecified: Secondary | ICD-10-CM | POA: Diagnosis present

## 2015-03-10 MED ORDER — SENNOSIDES-DOCUSATE SODIUM 8.6-50 MG PO TABS: 1.0000 | ORAL_TABLET | Freq: Every day | ORAL | Status: DC

## 2015-03-10 MED ORDER — LISINOPRIL 20 MG PO TABS: 20.0000 mg | ORAL_TABLET | Freq: Every day | ORAL | Status: DC

## 2015-03-10 MED ORDER — AMLODIPINE BESYLATE 5 MG PO TABS: 5.0000 mg | ORAL_TABLET | Freq: Every day | ORAL | Status: DC

## 2015-03-10 NOTE — Progress Notes (Signed)
STROKE TEAM PROGRESS NOTE   SUBJECTIVE (INTERVAL HISTORY) Patient up in chair, eating breakfast. Complains of feeling cold. No other complains.  OBJECTIVE Temp:  [97.5 F (36.4 C)-98.7 F (37.1 C)] 97.5 F (36.4 C) (10/26 0953) Pulse Rate:  [41-68] 66 (10/26 0953) Cardiac Rhythm:  [-]  Resp:  [16-18] 16 (10/26 0953) BP: (133-158)/(50-84) 158/55 mmHg (10/26 0953) SpO2:  [97 %-100 %] 98 % (10/26 0953)  CBC:   Recent Labs Lab 03/03/15 1733 03/03/15 1741 03/08/15 0455  WBC 9.2  --  8.4  NEUTROABS 5.4  --   --   HGB 12.3 13.6 12.0  HCT 35.3* 40.0 34.3*  MCV 80.6  --  79.4  PLT 160  --  141*    Basic Metabolic Panel:   Recent Labs Lab 03/03/15 1733 03/03/15 1741 03/08/15 0455  NA 137 138 137  K 4.3 4.3 4.0  CL 106 106 104  CO2 23  --  24  GLUCOSE 152* 155* 133*  BUN 21* 24* 20  CREATININE 1.06* 1.10* 0.85  CALCIUM 10.2  --  9.9    PHYSICAL EXAM Frail petite elderly African-American lady not in distress. . Afebrile. Head is nontraumatic. Neck is supple without bruit. Some radiation of heart murmur to carotids bilaterally.    Cardiac exam - 2/6 systolic murmur. Lungs are clear to auscultation. Distal pulses are well felt. Neurological Exam :  Awake alert oriented to person only. Diminished attention, registration and recall. Speech is fluent without dysarthria or aphasia. Extraocular movements are full range without nystagmus. Fundi were not visualized. Vision acuity seems adequate. Visual fields seem full to bedside confrontational testing. Right lower facial mild weakness. Tongue midline. Motor system exam no upper or lower extremity drift. Symmetric and equal strength in all 4 extremities. No focal weakness. Touch and sensation is intact bilaterally. Coordination slow but accurate. Gait was not tested.   ASSESSMENT/PLAN [name] is a 81 y.o. female with history of dementia, hypertension presenting with facial droop. CT showed L corona radiata hemorrhage.    Stroke:   left corona radiata small hemorrhage most likely secondary to  acclerated hypertension  Resultant  Facial droop improved  CTA head   Asymptomatic R stenosis. No correctable large vessel stenosis   SCDs for VTE prophylaxis Diet regular Room service appropriate?: Yes; Fluid consistency:: Thin  No antithrombotic prior to admission   Ongoing aggressive stroke risk factor management  transfer to floor  Therapy recommendations:  CIR consult completed. CIR candidate assuming family support at discharge. Rehab meeting with her son today- rehab met with sisters. They are unable to provide care for pt at discharge. All agreeable to SNF placement  Disposition:  pending (lives alone, husband in [place] in [place], pt currently visiting son in [place]) SNF when bed available. Medically ready for discharge  Accelerated Hypertension  BP on arrival 199/52, 200/84 - normalizing   At SBP goal < 160  Added low dose norvasc 79m to lisinopril 20 during hospitalization  Hyperlipidemia  Home meds:  Fish oil  Continue Fish oil at discharge  Other Stroke Risk Factors  Advanced age  Other Active Problems  Baseline dementia   Hospital day # 7  JRosalin Hawking MD PhD Stroke Neurology 03/10/2015 1:40 PM     To contact Stroke Continuity provider, please refer to [email or web address] After hours, contact General Neurology

## 2015-03-10 NOTE — Assessment & Plan Note (Signed)
L corona radiata hemorrhage secondary to hypertensive emergency

## 2015-03-10 NOTE — Discharge Summary (Signed)
Stroke Discharge Summary  Patient ID: Christine Kramer   MRN: [id number]      DOB: June 07, 1920  Date of Admission: 03/03/2015 Date of Discharge: 03/11/2015  Attending Physician:  Rosalin Hawking, MD, Stroke MD  Consulting Physician(s):      Delice Lesch, MD (Physical Medicine & Rehabtilitation)  Patient's PCP:  Pcp Not In System  DISCHARGE DIAGNOSIS:  Principal Problem:   ICH (intracerebral hemorrhage) ([place]) - left corona radiata small hemorrhage most likely secondary to hypertensive emergency Active Problems:   Hypertensive emergency   Hyperlipidemia   Dementia  Past Medical History  Diagnosis Date  . Dementia   . High cholesterol   . Hypertension    Past Surgical History  Procedure Laterality Date  . Abdominal hysterectomy        Medication List    STOP taking these medications        mirtazapine 15 MG tablet  Commonly known as:  REMERON     quinapril 20 MG tablet  Commonly known as:  ACCUPRIL      TAKE these medications        amLODipine 5 MG tablet  Commonly known as:  NORVASC  Take 1 tablet (5 mg total) by mouth daily.     calcium-vitamin D 500-200 MG-UNIT tablet  Commonly known as:  OSCAL WITH D  Take 1 tablet by mouth daily with breakfast.     Fish Oil 1000 MG Caps  Take 1,000 mg by mouth daily.     latanoprost 0.005 % ophthalmic solution  Commonly known as:  XALATAN  Place 1 drop into both eyes at bedtime.     lisinopril 20 MG tablet  Commonly known as:  PRINIVIL,ZESTRIL  Take 1 tablet (20 mg total) by mouth daily.     multivitamin with minerals Tabs tablet  Take 1 tablet by mouth daily.     senna-docusate 8.6-50 MG tablet  Commonly known as:  Senokot-S  Take 1 tablet by mouth at bedtime.     tolterodine 2 MG tablet  Commonly known as:  DETROL  Take 2 mg by mouth 2 (two) times daily.        LABORATORY STUDIES CBC    Component Value Date/Time   WBC 8.4 03/08/2015 0455   RBC 4.32 03/08/2015 0455   HGB 12.0 03/08/2015 0455   HCT  34.3* 03/08/2015 0455   PLT 141* 03/08/2015 0455   MCV 79.4 03/08/2015 0455   MCH 27.8 03/08/2015 0455   MCHC 35.0 03/08/2015 0455   RDW 13.8 03/08/2015 0455   LYMPHSABS 2.6 03/03/2015 1733   MONOABS 1.0 03/03/2015 1733   EOSABS 0.2 03/03/2015 1733   BASOSABS 0.0 03/03/2015 1733   CMP    Component Value Date/Time   NA 137 03/08/2015 0455   K 4.0 03/08/2015 0455   CL 104 03/08/2015 0455   CO2 24 03/08/2015 0455   GLUCOSE 133* 03/08/2015 0455   BUN 20 03/08/2015 0455   CREATININE 0.85 03/08/2015 0455   CALCIUM 9.9 03/08/2015 0455   PROT 7.7 03/03/2015 1733   ALBUMIN 3.9 03/03/2015 1733   AST 26 03/03/2015 1733   ALT 14 03/03/2015 1733   ALKPHOS 61 03/03/2015 1733   BILITOT 0.7 03/03/2015 1733   GFRNONAA 58* 03/08/2015 0455   GFRAA >60 03/08/2015 0455   COAGS Lab Results  Component Value Date   INR 1.15 03/03/2015   Lipid PanelNo results found for: CHOL, TRIG, HDL, CHOLHDL, VLDL, LDLCALC HgbA1C No results found for:  HGBA1C Cardiac Panel (last 3 results) No results for input(s): CKTOTAL, CKMB, TROPONINI, RELINDX in the last 72 hours. Urinalysis    Component Value Date/Time   COLORURINE YELLOW 04/18/2007 1442   APPEARANCEUR CLOUDY* 04/18/2007 1442   LABSPEC 1.009 04/18/2007 1442   PHURINE 8.0 04/18/2007 1442   GLUCOSEU NEGATIVE 04/18/2007 1442   HGBUR MODERATE* 04/18/2007 1442   BILIRUBINUR NEGATIVE 04/18/2007 1442   KETONESUR NEGATIVE 04/18/2007 1442   PROTEINUR NEGATIVE 04/18/2007 1442   UROBILINOGEN 0.2 04/18/2007 1442   NITRITE NEGATIVE 04/18/2007 1442   LEUKOCYTESUR SMALL* 04/18/2007 1442   Urine Drug Screen No results found for: LABOPIA, COCAINSCRNUR, LABBENZ, AMPHETMU, THCU, LABBARB  Alcohol Level No results found for: [facility]   SIGNIFICANT DIAGNOSTIC STUDIES Ct Head Wo Contrast 03/03/2015 1. Focal high attenuation in the left corona radiata is worrisome for acute hemorrhage. Calcification is considered less likely.   CT angio Head  03/04/2015 1.  Stable focus of hemorrhage within the left centrum semi ovale. 2. Stable atrophy and white matter disease without expansion of the infarct or new infarcts. 3. High-grade stenosis in the anterior right M2 division. 4. At least 60% stenosis within the cavernous right internal carotid artery. The lumen measurement is distal calcification in the stenosis within the calcified segment may be greater than this. 5. Asymmetric attenuation of anterior right MCA branches. 6. Moderate proximal PCA stenoses bilaterally.     HISTORY OF PRESENT ILLNESS Christine Kramer is a 81 y.o. female with a history of dementia, hypertension who presents with facial droop that started earlier today (LKW 03/03/2015 at 1500). She was brought into the emergency room at [facility] where code stroke was activated. CT showed a small hemorrhage in the left corona radiata.  She has a mild right facial droop and is otherwise asymptomatic. She does have a history of dementia. Patient was not administered TPA secondary to [place]. She was admitted to the neuro ICU for further evaluation and treatment.    HOSPITAL COURSE Christine Kramer is a 81 y.o. female with history of dementia, hypertension presenting with facial droop. CT showed L corona radiata hemorrhage.   Stroke: left corona radiata small hemorrhage most likely secondary to hypertensive emergency  Resultant Facial droop improved  CTA head Asymptomatic R stenosis. No correctable large vessel stenosis   No antithrombotic prior to admission   Ongoing aggressive stroke risk factor management  Therapy recommendations: CIR consult completed. Family unable to provide care for pt at discharge. All agreeable to SNF placement  Disposition: SNF in [place] (lives alone, husband in [place] in [place], pt currently visiting son in [place], 2 sisters in [place])   Hypertensive Emergency with neurologic symptoms  BP on arrival 199/52, 200/84 - normalizing   At SBP goal < 160  Added low  dose norvasc 5mg  to lisinopril 20 during hospitalization  Hyperlipidemia  Home meds: Fish oil  Continue Fish oil at discharge  Other Stroke Risk Factors  Advanced age  Other Active Problems  Baseline dementia   DISCHARGE EXAM Blood pressure 150/62, pulse 68, temperature 98.6 F (37 C), temperature source Oral, resp. rate 16, height 5\' 6"  (1.676 m), weight 63.3 kg (139 lb 8.8 oz), SpO2 98 %. Frail petite elderly African-American lady not in distress. . Afebrile. Head is nontraumatic. Neck is supple without bruit. Some radiation of heart murmur to carotids bilaterally. Cardiac exam - 2/6 systolic murmur. Lungs are clear to auscultation. Distal pulses are well felt. Neurological Exam :  Awake alert oriented to person  only. Diminished attention, registration and recall. Speech is fluent without dysarthria or aphasia. Extraocular movements are full range without nystagmus. Fundi were not visualized. Vision acuity seems adequate. Visual fields seem full to bedside confrontational testing. Right lower facial mild weakness. Tongue midline. Motor system exam no upper or lower extremity drift. Symmetric and equal strength in all 4 extremities. No focal weakness. Touch and sensation is intact bilaterally. Coordination slow but accurate. Gait was not tested.  Discharge Diet   Diet regular Room service appropriate?: Yes; Fluid consistency:: Thin liquids  DISCHARGE PLAN  Disposition:  Discharge to skilled nursing facility for ongoing PT, OT and ST.    Due to hemorrhage and risk of bleeding, do not take aspirin, aspirin-containing medications, or ibuprofen products (for example, Ibuprofen, Anacin, Alka-Seltzer, Bufferin, Ecotrin, Dristan, Sine-Off, Midol, Nuprin, Aleve, Thera-Flu, Advil). You may take Tylenol.   Follow-up MD in SNF   Follow-up with Dr. Rosalin Hawking, Stroke Clinic in 2 months.  30 minutes were spent preparing discharge.  [place] for  Pager information 03/11/2015 10:50 AM   I, the attending vascular neurologist, have personally obtained a history, examined the patient, evaluated laboratory data, individually viewed imaging studies and agree with radiology interpretations. Together with the NP/PA, we formulated the assessment and plan of care which reflects our mutual decision.  I have made any additions or clarifications directly to the above note and agree with the findings and plan as currently documented.    Rosalin Hawking, MD PhD Stroke Neurology 03/11/2015 3:05 PM

## 2015-03-11 DIAGNOSIS — I161 Hypertensive emergency: Secondary | ICD-10-CM

## 2015-03-11 NOTE — Progress Notes (Signed)
Pt discharged at this time via [facility] stretcher transport to SNF taking all personal belongings. Son at bedside.  Pt stable, no noted distress. Denies pain or discomfort. Attempted x3 to call [facility] to give report to nurse.

## 2015-03-11 NOTE — Clinical Social Work Placement (Signed)
   CLINICAL SOCIAL WORK PLACEMENT  NOTE  Date:  03/11/2015  Patient Details  Name: Christine Kramer MRN: [id number] Date of Birth: 1920-07-29  Clinical Social Work is seeking post-discharge placement for this patient at the [place] level of care (*CSW will initial, date and re-position this form in  chart as items are completed):  Yes   Patient/family provided with [facility] Work Department's list of facilities offering this level of care within the geographic area requested by the patient (or if unable, by the patient's family).  Yes   Patient/family informed of their freedom to choose among providers that offer the needed level of care, that participate in Medicare, Medicaid or managed care program needed by the patient, have an available bed and are willing to accept the patient.  Yes   Patient/family informed of [facility]'s ownership interest in [facility] and [facility], as well as of the fact that they are under no obligation to receive care at these facilities.  PASRR submitted to EDS on 03/08/15     PASRR number received on  03/09/15     Existing PASRR number confirmed on       FL2 transmitted to all facilities in geographic area requested by pt/family on 03/08/15     FL2 transmitted to all facilities within larger geographic area on       Patient informed that his/her managed care company has contracts with or will negotiate with certain facilities, including the following:         YES   Patient/family informed of bed offers received.  Patient [facility] bed at  [facility]     Physician recommends and patient chooses bed at      Patient to be transferred to  [facility] on  03/11/15.  Patient to be transferred to facility by  ambulance     Patient family notified on  03/11/15 of transfer.  Name of family member notified:   Son, near the bedside     PHYSICIAN Please sign FL2     Additional Comment:     _______________________________________________ Sable Feil, LCSW 03/11/2015, 5:34 PM

## 2015-03-11 NOTE — Progress Notes (Signed)
Occupational Therapy Treatment Patient Details Name: Christine Kramer MRN: [id number] DOB: 1920/06/16 Today's Date: 03/11/2015    History of present illness Christine Kramer is a 81 y.o. female with a history of dementia, hypertension who presents with facial droop that started earlier today.  CT showed a small hemorrhage in the left corona radiata.   OT comments  Focus of session on seated grooming and toileting.  Pt with anxiety with regard to falling, needs step by step cues for technique and physical assist for hand placement with increased posterior lean and flexed posture.  Total assist for pericare. Pt continues to demonstrate impaired cognition, attempting to use phone as call button.  Follow Up Recommendations  Supervision/Assistance - 24 hour;SNF    Equipment Recommendations       Recommendations for Other Services      Precautions / Restrictions Precautions Precautions: Fall Restrictions Weight Bearing Restrictions: No       Mobility Bed Mobility Pt in chair.           Transfers Overall transfer level: Needs assistance  Transfers: Sit to/from Stand;Stand Pivot Transfers Sit to Stand: Mod assist Stand pivot transfers: Mod assist       General transfer comment: cues and physical assist for hand placement, to rise and shift weight over her feet    Balance     Sitting balance-Leahy Scale: Fair Sitting balance - Comments: steady on commode     Standing balance-Leahy Scale: Zero                     ADL Overall ADL's : Needs assistance/impaired     Grooming: Wash/dry hands;Wash/dry face;Sitting;Set up                   Toilet Transfer: Moderate assistance;Stand-pivot;BSC Toilet Transfer Details (indicate cue type and reason): posterior lean, flexed postur Toileting- Clothing Manipulation and Hygiene: Total assistance;Sit to/from stand         General ADL Comments: pt needing reassurance for mobility and that she is not a burden        Vision                     Perception     Praxis      Cognition   Behavior During Therapy: Anxious Overall Cognitive Status: No family/caregiver present to determine baseline cognitive functioning Area of Impairment: Orientation;Safety/judgement;Awareness;Memory;Attention Orientation Level: Time;Situation;Place Current Attention Level: Sustained Memory: Decreased short-term memory  Following Commands: Follows one step commands inconsistently Safety/Judgement: Decreased awareness of safety;Decreased awareness of deficits Awareness: Intellectual Problem Solving: Slow processing;Difficulty sequencing;Requires verbal cues;Requires tactile cues General Comments: pt requiring physical assist to release grasp of chair/walker with transfer    Extremity/Trunk Assessment               Exercises     Shoulder Instructions       General Comments      Pertinent Vitals/ Pain       Pain Assessment: No/denies pain  Home Living                                          Prior Functioning/Environment              Frequency Min 2X/week     Progress Toward Goals  OT Goals(current goals can now be found in the care plan section)  Progress towards OT goals: Progressing toward goals  Acute Rehab OT Goals Patient Stated Goal: pt wants to get back home Time For Goal Achievement: 03/12/15  Plan Discharge plan remains appropriate    Co-evaluation                 End of Session Equipment Utilized During Treatment: Gait belt   Activity Tolerance Patient tolerated treatment well   Patient Left in chair;with call bell/phone within reach;with chair alarm set   Nurse Communication  (aware of BM)        Time: 1030-1046 OT Time Calculation (min): 16 min  Charges: OT General Charges $OT Visit: 1 Procedure OT Treatments $Self Care/Home Management : 8-22 mins  Malka So 03/11/2015, 11:17 AM  [phone]

## 2015-03-11 NOTE — Progress Notes (Signed)
Physical Therapy Treatment Patient Details Name: Christine Kramer MRN: [id number] DOB: Nov 30, 1920 Today's Date: 03/11/2015    History of Present Illness Christine Kramer is a 81 y.o. female with a history of dementia, hypertension who presents with facial droop that started earlier today.  CT showed a small hemorrhage in the left corona radiata.    PT Comments    Very internally distracted re: nausea and not wanting to eat breakfast (perseverating on this topic). Able to use this distraction to facilitate her walking (she was too distracted to also be fearful of walking). However after participating x 20 feet, she refused to do anything more and reporting continued nausea.    Follow Up Recommendations  SNF     Equipment Recommendations  None recommended by PT    Recommendations for Other Services       Precautions / Restrictions Precautions Precautions: Fall Restrictions Weight Bearing Restrictions: No    Mobility  Bed Mobility Overal bed mobility: Needs Assistance Bed Mobility: Supine to Sit Rolling: Supervision         General bed mobility comments: HOB elevated  Transfers Overall transfer level: Needs assistance Equipment used: Rolling walker (2 wheeled) Transfers: Sit to/from Stand Sit to Stand: Mod assist         General transfer comment: pt braced her calfs against bed and "leveraged" herself to standing with posterior lean; assist to steady and achieve upright   Ambulation/Gait Ambulation/Gait assistance: Mod assist;+2 safety/equipment Ambulation Distance (Feet): 20 Feet Assistive device: Rolling walker (2 wheeled) Gait Pattern/deviations: Step-through pattern;Decreased stride length;Narrow base of support;Trunk flexed;Drifts right/left Gait velocity: decr   General Gait Details: very distracted re: breakfast and able to physically facilitate her to walk without resistance (previously resisted due to fear of falling); required assist to move RW  forward/turn, assist to balance and +2 for chair follwo   Stairs            Wheelchair Mobility    Modified Rankin (Stroke Patients Only) Modified Rankin (Stroke Patients Only) Pre-Morbid Rankin Score: No significant disability Modified Rankin: Moderately severe disability     Balance     Sitting balance-Leahy Scale: Fair       Standing balance-Leahy Scale: Zero                      Cognition Arousal/Alertness: Awake/alert Behavior During Therapy: Restless;Anxious Overall Cognitive Status: No family/caregiver present to determine baseline cognitive functioning Area of Impairment: Orientation;Safety/judgement;Awareness;Memory;Attention Orientation Level: Time;Situation;Place Current Attention Level: Sustained (distracted when sisters in room) Memory: Decreased short-term memory   Safety/Judgement: Decreased awareness of safety;Decreased awareness of deficits Awareness: Intellectual   General Comments: focused on not liking her breakfast and someone trying to make her eat it; repeated questions >5 times with no awareness she had already asked    Exercises      General Comments        Pertinent Vitals/Pain Pain Assessment: No/denies pain    Home Living                      Prior Function            PT Goals (current goals can now be found in the care plan section) Acute Rehab PT Goals Patient Stated Goal: pt wants to get back home Time For Goal Achievement: 03/18/15 Progress towards PT goals: Progressing toward goals    Frequency  Min 3X/week    PT Plan Current plan remains appropriate  Co-evaluation             End of Session Equipment Utilized During Treatment: Gait belt Activity Tolerance: Treatment limited secondary to agitation (angry re: breakfast and feeling nauseaous) Patient left: in chair;with call bell/phone within reach;with chair alarm set     Time: [phone] PT Time Calculation (min) (ACUTE ONLY): 11  min  Charges:  $Gait Training: 8-22 mins                    G Codes:      Krislyn Donnan March 29, 2015, 9:16 AM Pager [phone]

## 2015-03-12 NOTE — Progress Notes (Signed)
Summary:  Patient was scheduled to be d/c'd today to [facility]- however the facility withdrew their bed offer around 4:30 pm after not being able to connect with patient's son to get admission paperwork done as well as being unable to secure authorization from [place].  Son Kyung Rudd was unable to get away from work to come to facility to sign admission paperwork and it was too late in the day to locate another facility.  CSW spoke with Katharine Look- Admissions at [facility] who stated she would initiate the auth and would consider an LOG but would have to have son to sign admission paperwork prior to bringing patient in  Son stated he could not be in [place] until about 7 pm which would be too late to do paperwork. Patient was not stable to be d/c'd home. There was no one to care for her and she has had a CVA.  CSW will facilitate d/c tomorrow morning asap.CSW discussed with Shawna Orleans, Surveyor, quantity of [place] Dept.  Lorie Phenix. Pauline Good, [place]

## 2015-03-15 ENCOUNTER — Non-Acute Institutional Stay: Payer: Medicaid Other | Admitting: Internal Medicine

## 2015-03-15 ENCOUNTER — Encounter: Payer: Self-pay | Admitting: Internal Medicine

## 2015-03-15 DIAGNOSIS — I10 Essential (primary) hypertension: Secondary | ICD-10-CM

## 2015-03-15 DIAGNOSIS — E785 Hyperlipidemia, unspecified: Secondary | ICD-10-CM

## 2015-03-15 DIAGNOSIS — N3281 Overactive bladder: Secondary | ICD-10-CM

## 2015-03-15 DIAGNOSIS — I610 Nontraumatic intracerebral hemorrhage in hemisphere, subcortical: Secondary | ICD-10-CM

## 2015-03-15 DIAGNOSIS — F0390 Unspecified dementia without behavioral disturbance: Secondary | ICD-10-CM | POA: Diagnosis not present
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
## 2015-03-16 DIAGNOSIS — H409 Unspecified glaucoma: Secondary | ICD-10-CM | POA: Insufficient documentation

## 2015-03-16 DIAGNOSIS — N3281 Overactive bladder: Secondary | ICD-10-CM | POA: Insufficient documentation

## 2015-03-16 DIAGNOSIS — I10 Essential (primary) hypertension: Secondary | ICD-10-CM | POA: Insufficient documentation

## 2015-05-14 ENCOUNTER — Non-Acute Institutional Stay: Payer: Medicaid Other | Admitting: Nurse Practitioner

## 2015-05-14 DIAGNOSIS — F0390 Unspecified dementia without behavioral disturbance: Secondary | ICD-10-CM | POA: Diagnosis not present

## 2015-05-14 DIAGNOSIS — E785 Hyperlipidemia, unspecified: Secondary | ICD-10-CM | POA: Diagnosis not present

## 2015-05-14 DIAGNOSIS — I10 Essential (primary) hypertension: Secondary | ICD-10-CM | POA: Diagnosis not present

## 2015-05-14 DIAGNOSIS — I610 Nontraumatic intracerebral hemorrhage in hemisphere, subcortical: Secondary | ICD-10-CM

## 2015-05-14 DIAGNOSIS — N3281 Overactive bladder: Secondary | ICD-10-CM | POA: Diagnosis not present

## 2015-05-14 NOTE — Progress Notes (Signed)
Patient ID: Christine Kramer, female   DOB: [birth year], 81 y.o.   MRN: [id number]    Nursing Home Location:  [place] of Service: SNF (31)  PCP: Pcp Not In System  No Known Allergies  Chief Complaint  Patient presents with  . Medical Management of Chronic Issues    HPI:  Patient is a 81 y.o. female seen today at [facility] for routine follow up on chronic conditions. Pt at [facility] after hospitalization for ICH. Pt with a hx of dementia, HTN, hyperlipidemia, glaucoma. Pt conts to work with therapy after ICH. Pt does not have complaints today. Nursing without concerns at this time.  Review of Systems:  Review of Systems  Constitutional: Negative for activity change, appetite change, fatigue and unexpected weight change.  HENT: Negative for congestion and hearing loss.   Eyes: Negative.   Respiratory: Negative for cough and shortness of breath.   Cardiovascular: Negative for chest pain, palpitations and leg swelling.  Gastrointestinal: Negative for abdominal pain, diarrhea and constipation.  Genitourinary: Negative for dysuria and difficulty urinating.  Musculoskeletal: Negative for myalgias and arthralgias.  Skin: Negative for color change and wound.  Neurological: Negative for dizziness and weakness.  Psychiatric/Behavioral: Positive for confusion. Negative for behavioral problems and agitation.    Past Medical History  Diagnosis Date  . Dementia   . High cholesterol   . Hypertension    Past Surgical History  Procedure Laterality Date  . Abdominal hysterectomy     Social History:   reports that she has never smoked. She does not have any smokeless tobacco history on file. Her alcohol and drug histories are not on file.  No family history on file.  Medications: Patient's Medications  New Prescriptions   No medications on file  Previous Medications   AMLODIPINE (NORVASC) 5 MG TABLET    Take 1 tablet (5 mg total) by mouth daily.    CALCIUM-VITAMIN D (OSCAL WITH D) 500-200 MG-UNIT TABLET    Take 1 tablet by mouth daily with breakfast.   LATANOPROST (XALATAN) 0.005 % OPHTHALMIC SOLUTION    Place 1 drop into both eyes at bedtime.   LISINOPRIL (PRINIVIL,ZESTRIL) 20 MG TABLET    Take 1 tablet (20 mg total) by mouth daily.   MULTIPLE VITAMIN (MULTIVITAMIN WITH MINERALS) TABS TABLET    Take 1 tablet by mouth daily.   OMEGA-3 FATTY ACIDS (FISH OIL) 1000 MG CAPS    Take 1,000 mg by mouth daily.   SENNA-DOCUSATE (SENOKOT-S) 8.6-50 MG TABLET    Take 1 tablet by mouth at bedtime.   TOLTERODINE (DETROL) 2 MG TABLET    Take 2 mg by mouth 2 (two) times daily.  Modified Medications   No medications on file  Discontinued Medications   No medications on file     Physical Exam: Filed Vitals:   05/14/15 1513  BP: 136/58  Pulse: 75  Temp: 98.2 F (36.8 C)  Resp: 18  Weight: 135 lb (61.236 kg)    Physical Exam  Constitutional: She appears well-developed and well-nourished.  HENT:  Mouth/Throat: Oropharynx is clear and moist. No oropharyngeal exudate.  Eyes: Pupils are equal, round, and reactive to light. No scleral icterus.  Neck: Neck supple.  Cardiovascular: Normal rate and regular rhythm.  Exam reveals no gallop and no friction rub.   Murmur heard.  Systolic murmur is present with a grade of 2/6  Pulmonary/Chest: Effort normal and breath sounds normal. No respiratory distress. She has  no wheezes. She has no rales.  Abdominal: Soft. Bowel sounds are normal. There is no hepatomegaly.  Musculoskeletal: She exhibits no edema or tenderness.  Neurological: She is alert.  Skin: Skin is warm and dry.  Psychiatric: She has a normal mood and affect. Her behavior is normal.    Labs reviewed: Basic Metabolic Panel:  Recent Labs  03/03/15 1733 03/03/15 1741 03/08/15 0455  NA 137 138 137  K 4.3 4.3 4.0  CL 106 106 104  CO2 23  --  24  GLUCOSE 152* 155* 133*  BUN 21* 24* 20  CREATININE 1.06* 1.10* 0.85  CALCIUM 10.2   --  9.9   Liver Function Tests:  Recent Labs  03/03/15 1733  AST 26  ALT 14  ALKPHOS 61  BILITOT 0.7  PROT 7.7  ALBUMIN 3.9   No results for input(s): LIPASE, AMYLASE in the last 8760 hours. No results for input(s): AMMONIA in the last 8760 hours. CBC:  Recent Labs  03/03/15 1733 03/03/15 1741 03/08/15 0455  WBC 9.2  --  8.4  NEUTROABS 5.4  --   --   HGB 12.3 13.6 12.0  HCT 35.3* 40.0 34.3*  MCV 80.6  --  79.4  PLT 160  --  141*   TSH: No results for input(s): TSH in the last 8760 hours. A1C: No results found for: HGBA1C Lipid Panel: No results for input(s): CHOL, HDL, LDLCALC, TRIG, CHOLHDL, LDLDIRECT in the last 8760 hours.  Assessment/Plan 1. Essential hypertension Blood pressure remains stable on norvasc 5 mg daily, will cont current regimen.   2. Nontraumatic subcortical hemorrhage of left cerebral hemisphere (HCC) Stable, no changes to neuro status, conts to work with PT/OT for rehab  3. Dementia, without behavioral disturbance -without acute changes to cognitive or functional status. Staff to provide support with ADLS.   4. OAB (overactive bladder) -pt without complaints of increase urination, with hx of dementia and noted complaints of "head not right" will stop at this time. If symptoms occur consider starting another medication.  5. Hyperlipidemia conts on fish oil     Kinga Cassar K. Harle Battiest  [facility] [phone] 8 am - 5 pm) [phone] (after hours)

## 2015-06-11 ENCOUNTER — Non-Acute Institutional Stay: Payer: Medicaid Other | Admitting: Nurse Practitioner

## 2015-06-11 ENCOUNTER — Encounter: Payer: Self-pay | Admitting: Nurse Practitioner

## 2015-06-11 DIAGNOSIS — I10 Essential (primary) hypertension: Secondary | ICD-10-CM | POA: Diagnosis not present

## 2015-06-11 DIAGNOSIS — K5901 Slow transit constipation: Secondary | ICD-10-CM

## 2015-06-11 DIAGNOSIS — F0390 Unspecified dementia without behavioral disturbance: Secondary | ICD-10-CM | POA: Diagnosis not present

## 2015-06-11 DIAGNOSIS — E785 Hyperlipidemia, unspecified: Secondary | ICD-10-CM | POA: Diagnosis not present

## 2015-06-11 NOTE — Progress Notes (Signed)
Patient ID: Christine Kramer, female   DOB: 1921/02/09, 82 y.o.   MRN: [id number]    Nursing Home Location:  [place] of Service: SNF (31)  PCP: Pcp Not In System  No Known Allergies  Chief Complaint  Patient presents with  . Medical Management of Chronic Issues    Routine Visit    HPI:  Patient is a 82 y.o. female seen today at [facility] for routine follow up on chronic conditions. Pt with a hx of dementia, HTN, hyperlipidemia, glaucoma. Pt has been doing well in the last month, without acute issues. Pt has been working with restorative. Nursing reports pt repeats questions frequently due to dementia otherwise no concerns. Pt with good appetite, no pain noted. No falls.    Review of Systems:  Review of Systems  Constitutional: Negative for activity change, appetite change, fatigue and unexpected weight change.  HENT: Negative for congestion and hearing loss.   Eyes: Negative.   Respiratory: Negative for cough and shortness of breath.   Cardiovascular: Negative for chest pain, palpitations and leg swelling.  Gastrointestinal: Negative for abdominal pain, diarrhea and constipation.  Genitourinary: Negative for dysuria and difficulty urinating.  Musculoskeletal: Negative for myalgias and arthralgias.  Skin: Negative for color change and wound.  Neurological: Negative for dizziness and weakness.  Psychiatric/Behavioral: Positive for confusion. Negative for behavioral problems and agitation.    Past Medical History  Diagnosis Date  . Dementia   . High cholesterol   . Hypertension    Past Surgical History  Procedure Laterality Date  . Abdominal hysterectomy     Social History:   reports that she has never smoked. She does not have any smokeless tobacco history on file. Her alcohol and drug histories are not on file.  History reviewed. No pertinent family history.  Medications: Patient's Medications  New Prescriptions   No  medications on file  Previous Medications   AMLODIPINE (NORVASC) 5 MG TABLET    Take 1 tablet (5 mg total) by mouth daily.   CALCIUM-VITAMIN D (OSCAL WITH D) 500-200 MG-UNIT TABLET    Take 1 tablet by mouth daily with breakfast.   LATANOPROST (XALATAN) 0.005 % OPHTHALMIC SOLUTION    Place 1 drop into both eyes at bedtime.   LISINOPRIL (PRINIVIL,ZESTRIL) 20 MG TABLET    Take 1 tablet (20 mg total) by mouth daily.   MULTIPLE VITAMIN (MULTIVITAMIN WITH MINERALS) TABS TABLET    Take 1 tablet by mouth daily.   NUTRITIONAL SUPPLEMENTS (NUTRITIONAL SUPPLEMENT PO)    Take 120 mLs by mouth daily.   OMEGA-3 FATTY ACIDS (FISH OIL) 1000 MG CAPS    Take 1,000 mg by mouth daily.   SENNA-DOCUSATE (SENOKOT-S) 8.6-50 MG TABLET    Take 1 tablet by mouth at bedtime.  Modified Medications   No medications on file  Discontinued Medications   TOLTERODINE (DETROL) 2 MG TABLET    Take 2 mg by mouth 2 (two) times daily.     Physical Exam: Filed Vitals:   06/11/15 1511  BP: 143/91  Pulse: 80  Temp: 97.8 F (36.6 C)  Resp: 18  Height: 5\' 6"  (1.676 m)  Weight: 132 lb 3.2 oz (59.966 kg)    Physical Exam  Constitutional: She appears well-developed and well-nourished.  HENT:  Mouth/Throat: Oropharynx is clear and moist. No oropharyngeal exudate.  Eyes: Pupils are equal, round, and reactive to light. No scleral icterus.  Neck: Neck supple.  Cardiovascular: Normal rate and  regular rhythm.  Exam reveals no gallop and no friction rub.   Murmur heard.  Systolic murmur is present with a grade of 2/6  Pulmonary/Chest: Effort normal and breath sounds normal. No respiratory distress. She has no wheezes. She has no rales.  Abdominal: Soft. Bowel sounds are normal. There is no hepatomegaly.  Musculoskeletal: She exhibits no edema or tenderness.  Neurological: She is alert.  Skin: Skin is warm and dry.  Psychiatric: She has a normal mood and affect. Her behavior is normal.    Labs reviewed: Basic Metabolic  Panel:  Recent Labs  03/03/15 1733 03/03/15 1741 03/08/15 0455  NA 137 138 137  K 4.3 4.3 4.0  CL 106 106 104  CO2 23  --  24  GLUCOSE 152* 155* 133*  BUN 21* 24* 20  CREATININE 1.06* 1.10* 0.85  CALCIUM 10.2  --  9.9   Liver Function Tests:  Recent Labs  03/03/15 1733  AST 26  ALT 14  ALKPHOS 61  BILITOT 0.7  PROT 7.7  ALBUMIN 3.9   No results for input(s): LIPASE, AMYLASE in the last 8760 hours. No results for input(s): AMMONIA in the last 8760 hours. CBC:  Recent Labs  03/03/15 1733 03/03/15 1741 03/08/15 0455  WBC 9.2  --  8.4  NEUTROABS 5.4  --   --   HGB 12.3 13.6 12.0  HCT 35.3* 40.0 34.3*  MCV 80.6  --  79.4  PLT 160  --  141*   TSH: No results for input(s): TSH in the last 8760 hours. A1C: No results found for: HGBA1C Lipid Panel: No results for input(s): CHOL, HDL, LDLCALC, TRIG, CHOLHDL, LDLDIRECT in the last 8760 hours.  Assessment/Plan 1. Essential hypertension Well controlled on norvasc and lisinopril   2. Dementia, without behavioral disturbance -unable to go home due to dementia. Not currently on medication, will start aricept 5 mg daily and increase to 10 mg PO qhs after 4 weeks.   3. Hyperlipidemia conts on fish oil, needs follow up lipid panel next month  4. Slow transit constipation Well control on current medication. Will cont senna-docusate daily      Jessica K. Harle Battiest  [facility] [phone] 8 am - 5 pm) [phone] (after hours)

## 2015-07-02 ENCOUNTER — Encounter: Payer: Self-pay | Admitting: Nurse Practitioner

## 2015-07-02 ENCOUNTER — Non-Acute Institutional Stay: Payer: Medicaid Other | Admitting: Nurse Practitioner

## 2015-07-02 DIAGNOSIS — K5901 Slow transit constipation: Secondary | ICD-10-CM

## 2015-07-02 DIAGNOSIS — I10 Essential (primary) hypertension: Secondary | ICD-10-CM | POA: Diagnosis not present

## 2015-07-02 DIAGNOSIS — F0390 Unspecified dementia without behavioral disturbance: Secondary | ICD-10-CM

## 2015-07-02 DIAGNOSIS — E785 Hyperlipidemia, unspecified: Secondary | ICD-10-CM

## 2015-07-02 DIAGNOSIS — H409 Unspecified glaucoma: Secondary | ICD-10-CM

## 2015-07-02 NOTE — Progress Notes (Signed)
Patient ID: Christine Kramer, female   DOB: June 23, 1920, 82 y.o.   MRN: [id number]    Nursing Home Location:  [place] of Service: SNF (31)  PCP: Pcp Not In System  No Known Allergies  Chief Complaint  Patient presents with  . Discharge Note    Discharge from facility    HPI:  Patient is a 82 y.o. female seen today at [facility] for discharge home. Pt with a hx of dementia, HTN, hyperlipidemia, glaucoma. Pt has been at [facility] after hospitalization for ICH possibility due to hypertensive emergency. Pt with dementia requiring increased assistance but has been doing well since. Pt family requesting discharge to move her back to [place] (where she is from) to be with family. Pt will have 24 hour care.   Review of Systems:  Review of Systems  Constitutional: Negative for activity change, appetite change, fatigue and unexpected weight change.  HENT: Negative for congestion and hearing loss.   Eyes: Negative.   Respiratory: Negative for cough and shortness of breath.   Cardiovascular: Negative for chest pain, palpitations and leg swelling.  Gastrointestinal: Negative for abdominal pain, diarrhea and constipation.  Genitourinary: Negative for dysuria and difficulty urinating.  Musculoskeletal: Negative for myalgias and arthralgias.  Skin: Negative for color change and wound.  Neurological: Negative for dizziness and weakness.  Psychiatric/Behavioral: Positive for confusion. Negative for behavioral problems and agitation.    Past Medical History  Diagnosis Date  . Dementia   . High cholesterol   . Hypertension    Past Surgical History  Procedure Laterality Date  . Abdominal hysterectomy     Social History:   reports that she has never smoked. She does not have any smokeless tobacco history on file. Her alcohol and drug histories are not on file.  History reviewed. No pertinent family history.  Medications: Patient's Medications  New  Prescriptions   No medications on file  Previous Medications   AMLODIPINE (NORVASC) 5 MG TABLET    Take 1 tablet (5 mg total) by mouth daily.   CALCIUM-VITAMIN D (OSCAL WITH D) 500-200 MG-UNIT TABLET    Take 1 tablet by mouth daily with breakfast.   DONEPEZIL (ARICEPT) 10 MG TABLET    Take 1/2 tablet (5mg ) by mouth at bedtime for 1 month.   LATANOPROST (XALATAN) 0.005 % OPHTHALMIC SOLUTION    Place 1 drop into both eyes at bedtime.   LISINOPRIL (PRINIVIL,ZESTRIL) 20 MG TABLET    Take 1 tablet (20 mg total) by mouth daily.   MULTIPLE VITAMIN (MULTIVITAMIN WITH MINERALS) TABS TABLET    Take 1 tablet by mouth daily.   NUTRITIONAL SUPPLEMENTS (NUTRITIONAL SUPPLEMENT PO)    Take 120 mLs by mouth daily.   OMEGA-3 FATTY ACIDS (FISH OIL) 1000 MG CAPS    Take 1,000 mg by mouth daily.   SENNA-DOCUSATE (SENOKOT-S) 8.6-50 MG TABLET    Take 1 tablet by mouth at bedtime.  Modified Medications   No medications on file  Discontinued Medications   No medications on file     Physical Exam: Filed Vitals:   07/02/15 0818  BP: 122/64  Pulse: 80  Temp: 97.8 F (36.6 C)  TempSrc: Oral  Resp: 18  Height: 5\' 6"  (1.676 m)  Weight: 129 lb 9.6 oz (58.786 kg)    Physical Exam  Constitutional: She appears well-developed and well-nourished.  HENT:  Mouth/Throat: Oropharynx is clear and moist. No oropharyngeal exudate.  Eyes: Pupils are equal, round, and  reactive to light. No scleral icterus.  Neck: Neck supple.  Cardiovascular: Normal rate and regular rhythm.  Exam reveals no gallop and no friction rub.   Murmur heard.  Systolic murmur is present with a grade of 2/6  Pulmonary/Chest: Effort normal and breath sounds normal. No respiratory distress. She has no wheezes. She has no rales.  Abdominal: Soft. Bowel sounds are normal. There is no hepatomegaly.  Musculoskeletal: She exhibits no edema or tenderness.  Neurological: She is alert.  Skin: Skin is warm and dry.  Psychiatric: She has a normal mood  and affect. Her behavior is normal.    Labs reviewed: Basic Metabolic Panel:  Recent Labs  03/03/15 1733 03/03/15 1741 03/08/15 0455  NA 137 138 137  K 4.3 4.3 4.0  CL 106 106 104  CO2 23  --  24  GLUCOSE 152* 155* 133*  BUN 21* 24* 20  CREATININE 1.06* 1.10* 0.85  CALCIUM 10.2  --  9.9   Liver Function Tests:  Recent Labs  03/03/15 1733  AST 26  ALT 14  ALKPHOS 61  BILITOT 0.7  PROT 7.7  ALBUMIN 3.9   No results for input(s): LIPASE, AMYLASE in the last 8760 hours. No results for input(s): AMMONIA in the last 8760 hours. CBC:  Recent Labs  03/03/15 1733 03/03/15 1741 03/08/15 0455  WBC 9.2  --  8.4  NEUTROABS 5.4  --   --   HGB 12.3 13.6 12.0  HCT 35.3* 40.0 34.3*  MCV 80.6  --  79.4  PLT 160  --  141*   TSH: No results for input(s): TSH in the last 8760 hours. A1C: No results found for: HGBA1C Lipid Panel: No results for input(s): CHOL, HDL, LDLCALC, TRIG, CHOLHDL, LDLDIRECT in the last 8760 hours.  Assessment/Plan 1. Essential hypertension -blood pressure remains stable, conts on amlodipine and lisinopril.   2. Dementia, without behavioral disturbance -moderate-severe memory loss. Started on aricept and tolerating well. Cont on aricept 10 mg daily at bedtime  3. Hyperlipidemia -stable, conts on fish oil  4. Slow transit constipation Stable on OTC,,conts on senokot-S   5. Glaucoma -conts on xalatan drops  pt is stable for discharge with 24 hour care.  No DME needed. Rx written.  will need to follow up with PCP within 2 weeks.        Carlos American. Harle Battiest  [facility] [phone] 8 am - 5 pm) [phone] (after hours)

## 2015-07-06 ENCOUNTER — Other Ambulatory Visit: Payer: Self-pay | Admitting: Nurse Practitioner

## 2015-10-01 ENCOUNTER — Other Ambulatory Visit: Payer: Self-pay | Admitting: Nurse Practitioner

## 2016-04-14 DIAGNOSIS — I609 Nontraumatic subarachnoid hemorrhage, unspecified: Secondary | ICD-10-CM

## 2016-04-17 ENCOUNTER — Emergency Department (HOSPITAL_COMMUNITY): Payer: Medicare Other

## 2016-04-17 ENCOUNTER — Encounter (HOSPITAL_COMMUNITY): Payer: Self-pay | Admitting: *Deleted

## 2016-04-17 ENCOUNTER — Observation Stay (HOSPITAL_COMMUNITY)
Admission: EM | Admit: 2016-04-17 | Discharge: 2016-04-18 | Disposition: A | Discharge status: Home / Self Care | Payer: Medicare Other | Attending: Internal Medicine | Admitting: Internal Medicine

## 2016-04-17 DIAGNOSIS — W19XXXD Unspecified fall, subsequent encounter: Secondary | ICD-10-CM | POA: Insufficient documentation

## 2016-04-17 DIAGNOSIS — R262 Difficulty in walking, not elsewhere classified: Secondary | ICD-10-CM | POA: Diagnosis not present

## 2016-04-17 DIAGNOSIS — I619 Nontraumatic intracerebral hemorrhage, unspecified: Secondary | ICD-10-CM | POA: Diagnosis present

## 2016-04-17 DIAGNOSIS — F0390 Unspecified dementia without behavioral disturbance: Secondary | ICD-10-CM | POA: Diagnosis not present

## 2016-04-17 DIAGNOSIS — Z791 Long term (current) use of non-steroidal anti-inflammatories (NSAID): Secondary | ICD-10-CM | POA: Insufficient documentation

## 2016-04-17 DIAGNOSIS — H409 Unspecified glaucoma: Secondary | ICD-10-CM | POA: Diagnosis not present

## 2016-04-17 DIAGNOSIS — I609 Nontraumatic subarachnoid hemorrhage, unspecified: Secondary | ICD-10-CM

## 2016-04-17 DIAGNOSIS — I10 Essential (primary) hypertension: Secondary | ICD-10-CM | POA: Diagnosis not present

## 2016-04-17 DIAGNOSIS — S066X0D Traumatic subarachnoid hemorrhage without loss of consciousness, subsequent encounter: Secondary | ICD-10-CM | POA: Diagnosis not present

## 2016-04-17 DIAGNOSIS — R531 Weakness: Secondary | ICD-10-CM | POA: Diagnosis not present

## 2016-04-17 DIAGNOSIS — E7800 Pure hypercholesterolemia, unspecified: Secondary | ICD-10-CM | POA: Insufficient documentation

## 2016-04-17 DIAGNOSIS — I610 Nontraumatic intracerebral hemorrhage in hemisphere, subcortical: Secondary | ICD-10-CM | POA: Diagnosis not present

## 2016-04-17 DIAGNOSIS — Z7982 Long term (current) use of aspirin: Secondary | ICD-10-CM | POA: Insufficient documentation

## 2016-04-17 DIAGNOSIS — Z79899 Other long term (current) drug therapy: Secondary | ICD-10-CM | POA: Diagnosis not present

## 2016-04-17 DIAGNOSIS — E785 Hyperlipidemia, unspecified: Secondary | ICD-10-CM | POA: Diagnosis present

## 2016-04-17 DIAGNOSIS — R55 Syncope and collapse: Secondary | ICD-10-CM | POA: Diagnosis present

## 2016-04-17 DIAGNOSIS — G309 Alzheimer's disease, unspecified: Secondary | ICD-10-CM | POA: Diagnosis not present

## 2016-04-17 HISTORY — DX: Nontraumatic subarachnoid hemorrhage, unspecified: I60.9

## 2016-04-17 LAB — I-STAT CHEM 8, ED
BUN: 17 mg/dL (ref 6–20)
CALCIUM ION: 1.27 mmol/L (ref 1.15–1.40)
CHLORIDE: 98 mmol/L — AB (ref 101–111)
CREATININE: 0.8 mg/dL (ref 0.44–1.00)
GLUCOSE: 93 mg/dL (ref 65–99)
HCT: 35 % — ABNORMAL LOW (ref 36.0–46.0)
HEMOGLOBIN: 11.9 g/dL — AB (ref 12.0–15.0)
POTASSIUM: 4.5 mmol/L (ref 3.5–5.1)
Sodium: 133 mmol/L — ABNORMAL LOW (ref 135–145)
TCO2: 29 mmol/L (ref 0–100)

## 2016-04-17 LAB — COMPREHENSIVE METABOLIC PANEL
ALK PHOS: 49 U/L (ref 38–126)
ALT: 16 U/L (ref 14–54)
AST: 25 U/L (ref 15–41)
Albumin: 3.7 g/dL (ref 3.5–5.0)
Anion gap: 7 (ref 5–15)
BUN: 14 mg/dL (ref 6–20)
CALCIUM: 9.9 mg/dL (ref 8.9–10.3)
CHLORIDE: 99 mmol/L — AB (ref 101–111)
CO2: 26 mmol/L (ref 22–32)
CREATININE: 0.78 mg/dL (ref 0.44–1.00)
GFR calc Af Amer: >60 mL/min (ref >60)
Glucose, Bld: 94 mg/dL (ref 65–99)
Potassium: 4.3 mmol/L (ref 3.5–5.1)
Sodium: 132 mmol/L — ABNORMAL LOW (ref 135–145)
Total Bilirubin: 1.1 mg/dL (ref 0.3–1.2)
Total Protein: 6.7 g/dL (ref 6.5–8.1)

## 2016-04-17 LAB — URINALYSIS, ROUTINE W REFLEX MICROSCOPIC
Bilirubin Urine: NEGATIVE
GLUCOSE, UA: NEGATIVE mg/dL
HGB URINE DIPSTICK: NEGATIVE
Ketones, ur: NEGATIVE mg/dL
Nitrite: NEGATIVE
Protein, ur: NEGATIVE mg/dL
SPECIFIC GRAVITY, URINE: 1.007 (ref 1.005–1.030)
pH: 7.5 (ref 5.0–8.0)

## 2016-04-17 LAB — CBC
HEMATOCRIT: 34.3 % — AB (ref 36.0–46.0)
HEMOGLOBIN: 11.9 g/dL — AB (ref 12.0–15.0)
MCH: 28.5 pg (ref 26.0–34.0)
MCHC: 34.7 g/dL (ref 30.0–36.0)
MCV: 82.1 fL (ref 78.0–100.0)
Platelets: 195 10*3/uL (ref 150–400)
RBC: 4.18 MIL/uL (ref 3.87–5.11)
RDW: 13.2 % (ref 11.5–15.5)
WBC: 7.8 10*3/uL (ref 4.0–10.5)

## 2016-04-17 LAB — DIFFERENTIAL
BASOS ABS: 0 10*3/uL (ref 0.0–0.1)
BASOS PCT: 0 %
Eosinophils Absolute: 0.2 10*3/uL (ref 0.0–0.7)
Eosinophils Relative: 2 %
LYMPHS ABS: 2.1 10*3/uL (ref 0.7–4.0)
LYMPHS PCT: 27 %
MONOS PCT: 9 %
Monocytes Absolute: 0.7 10*3/uL (ref 0.1–1.0)
NEUTROS ABS: 4.9 10*3/uL (ref 1.7–7.7)
Neutrophils Relative %: 62 %

## 2016-04-17 LAB — I-STAT TROPONIN, ED: TROPONIN I, POC: 0.07 ng/mL (ref 0.00–0.08)

## 2016-04-17 LAB — URINE MICROSCOPIC-ADD ON: RBC / HPF: NONE SEEN RBC/hpf (ref 0–5)

## 2016-04-17 LAB — PROTIME-INR
INR: 1
Prothrombin Time: 13.2 seconds (ref 11.4–15.2)

## 2016-04-17 LAB — APTT: APTT: 30 s (ref 24–36)

## 2016-04-17 MED ORDER — DONEPEZIL HCL 10 MG PO TABS
10.0000 mg | ORAL_TABLET | Freq: Every day | ORAL | Status: DC
  Administered 2016-04-17: 10 mg via ORAL
  Filled 2016-04-17: qty 1

## 2016-04-17 MED ORDER — LATANOPROST 0.005 % OP SOLN
1.0000 [drp] | Freq: Every day | OPHTHALMIC | Status: DC
Start: 2016-04-17 — End: 2016-04-18
  Administered 2016-04-17: 1 [drp] via OPHTHALMIC
  Filled 2016-04-17: qty 2.5

## 2016-04-17 MED ORDER — SENNOSIDES-DOCUSATE SODIUM 8.6-50 MG PO TABS
1.0000 | ORAL_TABLET | Freq: Every day | ORAL | Status: DC
  Administered 2016-04-17: 1 via ORAL
  Filled 2016-04-17: qty 1

## 2016-04-17 MED ORDER — AMLODIPINE BESYLATE 5 MG PO TABS
5.0000 mg | ORAL_TABLET | Freq: Every day | ORAL | Status: DC
  Filled 2016-04-17: qty 1

## 2016-04-17 MED ORDER — SODIUM CHLORIDE 0.9% FLUSH
3.0000 mL | Freq: Two times a day (BID) | INTRAVENOUS | Status: DC
  Administered 2016-04-17 – 2016-04-18 (×2): 3 mL via INTRAVENOUS

## 2016-04-17 MED ORDER — LISINOPRIL 20 MG PO TABS: 20.0000 mg | ORAL_TABLET | Freq: Every day | ORAL | Status: DC

## 2016-04-17 MED ORDER — LEVETIRACETAM 250 MG PO TABS
250.0000 mg | ORAL_TABLET | Freq: Two times a day (BID) | ORAL | Status: DC
  Administered 2016-04-17 – 2016-04-18 (×2): 250 mg via ORAL
  Filled 2016-04-17 (×2): qty 1

## 2016-04-17 MED ORDER — ACETAMINOPHEN 325 MG PO TABS
650.0000 mg | ORAL_TABLET | Freq: Four times a day (QID) | ORAL | Status: DC | PRN
  Administered 2016-04-17: 650 mg via ORAL
  Filled 2016-04-17 (×2): qty 2

## 2016-04-17 NOTE — ED Notes (Signed)
Pt returned from CT °

## 2016-04-17 NOTE — ED Notes (Signed)
Walked patient to the bathroom pt did just fine now patient is back in the bed resting

## 2016-04-17 NOTE — ED Provider Notes (Signed)
[facility] DEPT Provider Note   CSN: [id number] Arrival date & time: 04/17/16  1105   History   Chief Complaint Chief Complaint  Patient presents with  . Dizziness    recent fall with hospital admission    HPI Christine Kramer is a 82 y.o. female.  Patient presents after change in activity this morning noticed by son. Patient with PMHx of CVA, Dementia, HTN, and recent discharge from [facility] in [place], [place] for small subarachnoid hemorrhage after a fall (she was discharged on 12/02 with 7 day course of Keppra for seizure ppx). Per son, patient was trying to get out of bed this AM but was unable to fully do so due to complaints of head fullness and dizziness. No falls, no complaints of fever, chills, changes in bowel or bladder habits, no nausea/vomiting/diarrhea, denies vision or hearing changes (wears hearing aids - not in place during visit).      Past Medical History:  Diagnosis Date  . Dementia   . High cholesterol   . Hypertension    Patient Active Problem List   Diagnosis Date Noted  . Essential hypertension 03/16/2015  . OAB (overactive bladder) 03/16/2015  . Glaucoma 03/16/2015  . Hyperlipidemia 03/10/2015  . Dementia 03/10/2015  . ICH (intracerebral hemorrhage) ([place]) 03/03/2015   Past Surgical History:  Procedure Laterality Date  . ABDOMINAL HYSTERECTOMY     OB History    No data available      Home Medications    Prior to Admission medications   Medication Sig Start Date End Date Taking? Authorizing Provider  amLODipine (NORVASC) 5 MG tablet TAKE 1 TABLET BY MOUTH DAILY 07/06/15  Yes Lauree Chandler, NP  donepezil (ARICEPT) 10 MG tablet TAKE 1 TABLET BY MOUTH AT BEDTIME 07/06/15  Yes Lauree Chandler, NP  latanoprost (XALATAN) 0.005 % ophthalmic solution Place 1 drop into both eyes at bedtime. 01/21/15  Yes Historical Provider, MD  levETIRAcetam (KEPPRA) 250 MG tablet Take 250 mg by mouth 2 (two) times daily. Started on 04-15-16 for 6 days   Yes  Historical Provider, MD  lisinopril (PRINIVIL,ZESTRIL) 20 MG tablet TAKE 1 TABLET BY MOUTH DAILY 07/06/15  Yes Lauree Chandler, NP  senna-docusate (SENOKOT-S) 8.6-50 MG tablet Take 1 tablet by mouth at bedtime. 03/10/15  Yes Donzetta Starch, NP  calcium-vitamin D (OSCAL WITH D) 500-200 MG-UNIT tablet Take 1 tablet by mouth daily with breakfast.    Historical Provider, MD  Multiple Vitamin (MULTIVITAMIN WITH MINERALS) TABS tablet Take 1 tablet by mouth daily.    Historical Provider, MD  Nutritional Supplements (NUTRITIONAL SUPPLEMENT PO) Take 120 mLs by mouth daily.    Historical Provider, MD  Omega-3 Fatty Acids (FISH OIL) 1000 MG CAPS Take 1,000 mg by mouth daily.    Historical Provider, MD   Family History No family history on file.  Social History Social History  Substance Use Topics  . Smoking status: Never Smoker  . Smokeless tobacco: Never Used  . Alcohol use No     Allergies   Patient has no known allergies.   Review of Systems Review of Systems  Constitutional: Positive for activity change. Negative for appetite change, chills, fatigue and fever.  HENT: Negative for congestion.   Eyes: Negative for photophobia, pain and visual disturbance.  Respiratory: Negative for cough, chest tightness, shortness of breath and wheezing.   Cardiovascular: Negative for chest pain, palpitations and leg swelling.  Gastrointestinal: Negative for abdominal distention, abdominal pain, blood in stool, constipation, diarrhea,  nausea and vomiting.  Endocrine: Negative.   Genitourinary: Negative for decreased urine volume, difficulty urinating, dysuria, flank pain, hematuria and urgency.  Musculoskeletal: Positive for neck pain (left sided muscle). Negative for back pain and gait problem.  Skin: Negative for color change, rash and wound.  Neurological: Positive for dizziness. Negative for facial asymmetry, speech difficulty, weakness, numbness and headaches.  Psychiatric/Behavioral: Negative for  agitation, behavioral problems and confusion.    Physical Exam Updated Vital Signs BP 134/63   Pulse 69   Temp 98.1 F (36.7 C)   Resp 23   Ht 5\' 7"  (1.702 m)   Wt 54.4 kg   SpO2 100%   BMI 18.79 kg/m   Physical Exam  Constitutional: She appears well-developed and well-nourished. No distress.  HENT:  Head: Normocephalic and atraumatic.  Mouth/Throat: Oropharynx is clear and moist.  Eyes: EOM are normal. Right eye exhibits no discharge.  Bilateral pupillary constriction  Neck: Normal range of motion. Neck supple.  Cardiovascular: Normal rate, regular rhythm and intact distal pulses.  Exam reveals no gallop and no friction rub.   Murmur (2/6 systolic ejection murmur) heard. Pulmonary/Chest: Effort normal and breath sounds normal. No respiratory distress. She has no wheezes. She has no rales.  Musculoskeletal: Normal range of motion. She exhibits no edema or tenderness.  Lymphadenopathy:    She has no cervical adenopathy.  Neurological: She is alert. She has normal strength. She is disoriented (oriented only to self which is baseline). No cranial nerve deficit or sensory deficit. GCS eye subscore is 4. GCS verbal subscore is 5. GCS motor subscore is 6.  Skin: Skin is warm, dry and intact. No rash noted. She is not diaphoretic. No pallor.     ED Treatments / Results  Labs (all labs ordered are listed, but only abnormal results are displayed) Labs Reviewed  CBC - Abnormal; Notable for the following:       Result Value   Hemoglobin 11.9 (*)    HCT 34.3 (*)    All other components within normal limits  COMPREHENSIVE METABOLIC PANEL - Abnormal; Notable for the following:    Sodium 132 (*)    Chloride 99 (*)    All other components within normal limits  I-STAT CHEM 8, ED - Abnormal; Notable for the following:    Sodium 133 (*)    Chloride 98 (*)    Hemoglobin 11.9 (*)    HCT 35.0 (*)    All other components within normal limits  PROTIME-INR  APTT  DIFFERENTIAL    URINALYSIS, ROUTINE W REFLEX MICROSCOPIC (NOT AT [facility])  I-STAT TROPOININ, ED    EKG  EKG Interpretation  Date/Time:  Monday April 17 2016 11:38:09 EST Ventricular Rate:  62 PR Interval:    QRS Duration: 92 QT Interval:  468 QTC Calculation: 476 R Axis:   -61 Text Interpretation:  Sinus rhythm Left anterior fascicular block RSR' in V1 or V2, right VCD or RVH Left ventricular hypertrophy ST elevation similar to Oct 2016 Confirmed by Regenia Skeeter MD, SCOTT [phone]) on 04/17/2016 1:43:16 PM       Radiology Ct Head Wo Contrast  Result Date: 04/17/2016 CLINICAL DATA:  Dizziness status post subarachnoid hemorrhage EXAM: CT HEAD WITHOUT CONTRAST TECHNIQUE: Contiguous axial images were obtained from the base of the skull through the vertex without intravenous contrast. COMPARISON:  03/03/2015 FINDINGS: Brain: No evidence of acute infarction, extra-axial collection, ventriculomegaly, or mass effect. Small amount of hemorrhage in the high left parietal lobe sulcus consistent with subarachnoid  hemorrhage. Generalized cerebral atrophy. Periventricular white matter low attenuation likely secondary to microangiopathy. Vascular: Cerebrovascular atherosclerotic calcifications are noted. Skull: Negative for fracture or focal lesion. Sinuses/Orbits: Visualized portions of the orbits are unremarkable. Visualized portions of the paranasal sinuses and mastoid air cells are unremarkable. Other: None. IMPRESSION: 1. Small volume subarachnoid hemorrhage in the high left parietal lobe. 2. Chronic microvascular disease and cerebral atrophy. Electronically Signed   By: Kathreen Devoid   On: 04/17/2016 13:35    Procedures Procedures (including critical care time)  Medications Ordered in ED Medications - No data to display   Initial Impression / Assessment and Plan / ED Course  I have reviewed the triage vital signs and the nursing notes.  Pertinent labs & imaging results that were available during my care of the  patient were reviewed by me and considered in my medical decision making (see chart for details).  Clinical Course    VEDANSHI HIGGINBOTHAM is a 82yo female with PMH of dementia, CVA, recent small frontal SAH discharged from [facility] on 12/02 - no imaging or actual radiology report. Patient discharged on Keppra x 7 days and was told to hold ASA; patient was back to baseline at discharge. Patient with complaints of fullness in head, dizziness and presyncopal symptoms this AM. CMP remarkable for mild hyponatremia and hypochloremia, CBC without leukocytosis. UA was unremarkable for UTI. CT head wo contrast shows small left parietal SAH - discussed with neuro who agreed was likely her prior [facility]. Family worried about taking patient home.  Discussed case with unassigned admitting team -[facility] - will admit for observation tonight for pre-syncopal symptoms.   Family and patient in agreement with plan.   Final Clinical Impressions(s) / ED Diagnoses   Final diagnoses:  None   New Prescriptions New Prescriptions   No medications on file     Alphonzo Grieve, MD 04/17/16 [place], MD 04/18/16 [phone]

## 2016-04-17 NOTE — ED Notes (Signed)
Son-- [phone] -- Ray.

## 2016-04-17 NOTE — ED Notes (Signed)
Patient transported to CT 

## 2016-04-17 NOTE — ED Triage Notes (Signed)
PT fell on Thurs and was admitted to hospital in [place] for subarachnoid hemorrhage and released on Sat.  Per her son who she lives with she has been her normal self.  However today when trying to get out of bed a 2nd time (0900), she kept falling back and saying she felt funny in the head.  The pt normally takes asa but has not been taking since fall.

## 2016-04-17 NOTE — ED Notes (Signed)
Placed patient on the monitor into a gown provider is there

## 2016-04-17 NOTE — ED Notes (Signed)
ED Provider at bedside. 

## 2016-04-17 NOTE — Care Management Note (Signed)
Case Management Note  Patient Details  Name: Christine Kramer MRN: [id number] Date of Birth: 1920-10-13  Subjective/Objective:    Patient was brought to [facility] ED with c/o headache and neck pain s/p mechanical fall  12/2 and was hospitalized with a small subarachnoid hemorrhage. Patient's son reports patient having difficulty ambulating  Patient is visiting [place] from [place].  Unit CM will continue to follow for care transitional planning           Action/Plan:   Expected Discharge Date:                  Expected Discharge Plan:  [place]  In-House Referral:     Discharge [place]  Post Acute Care Choice:    Choice offered to:  Patient, Adult Children [name] (son) [phone])  DME Arranged:    DME Agency:     HH Arranged:    [place] Agency:     Status of Service:  In process, will continue to follow  If discussed at Long Length of Stay Meetings, dates discussed:    Additional CommentsLaurena Slimmer, RN 04/17/2016, 9:14 PM

## 2016-04-17 NOTE — ED Notes (Signed)
Spoke with dr Regenia Skeeter and he stated not to call code stroke d/t recent bleed and fact that neuro will be at bedside.

## 2016-04-17 NOTE — H&P (Signed)
History and Physical    Christine Kramer [id number] DOB: 1921/02/01 DOA: 04/17/2016  PCP: Pcp Not In System  Patient coming from: Family's Home   Chief Complaint: Headache s/p subarachnoid hemorrhage 4 days ago  HPI: Christine Kramer is a 82 y.o. female with medical history significant of hypertension and glaucoma that is in [place] visiting family that experienced a fall on 04/15/16.  Son was near patient at that time and said he attempted to break her fall but she still was able to hit the side of her head and her neck.  She was taken to the [facility] and then transferred to [facility] for evaluation.  She underwent a CT scan at [facility] and was found to have a small subarachnoid hemorrhage.  She was admitted to the hospital and was seen by neurosurgery on day of admission as well as the subsequent day of her admission and she was deemed stable for discharge.  She was started on Keppra 500mg  BID for seizure prophylaxis and had returned to her baseline mental status and neurologically was intact.  Her son states that she was fine after discharge until today when she had difficulty ambulating from the bathroom and keep appearing as though she would fall backwards.  Ultimately she required assistance with ambulating.  Of note, patient did not fall today.  Patient also repeatedly told her son that the back of her head hurt and that the side of her neck hurt.  She was brought to [facility] hospital for further evaluation.   ED Course: Patient and family were counseled by ED physician about the sequelae of her previous admission and that she could be experiencing symptoms related to that.  She was neurologically intact and at her baseline mental status which is oriented to person only.  Family is requesting patient admission as they would like evaluation by PT/OT as well as heart monitoring as she has never had a problem with ambulation prior to these two episodes.  Review of Systems: As  per HPI otherwise 10 point review of systems negative.  Negative for headache, fever, chills, changes in vision (from baseline), sinus pressure, numbness, weakness, altered mental status, syncope, chest pain, chest pressure, shortness of breath, increased work of breathing, abdominal pain, nausea, vomiting, diarrhea, constipation. Positive for decrease in hearing (for the past few months).  Past Medical History:  Diagnosis Date  . Dementia   . High cholesterol   . Hypertension     Past Surgical History:  Procedure Laterality Date  . ABDOMINAL HYSTERECTOMY       reports that she has never smoked. She has never used smokeless tobacco. She reports that she does not drink alcohol or use drugs.  No Known Allergies  No family history on file. Family history negative for bleeding disorders, heart disease, kidney disease.  Prior to Admission medications   Medication Sig Start Date End Date Taking? Authorizing Provider  amLODipine (NORVASC) 5 MG tablet TAKE 1 TABLET BY MOUTH DAILY 07/06/15  Yes Lauree Chandler, NP  donepezil (ARICEPT) 10 MG tablet TAKE 1 TABLET BY MOUTH AT BEDTIME 07/06/15  Yes Lauree Chandler, NP  latanoprost (XALATAN) 0.005 % ophthalmic solution Place 1 drop into both eyes at bedtime. 01/21/15  Yes Historical Provider, MD  levETIRAcetam (KEPPRA) 250 MG tablet Take 250 mg by mouth 2 (two) times daily. Started on 04-15-16 for 6 days   Yes Historical Provider, MD  lisinopril (PRINIVIL,ZESTRIL) 20 MG tablet TAKE 1 TABLET BY MOUTH  DAILY 07/06/15  Yes Lauree Chandler, NP  senna-docusate (SENOKOT-S) 8.6-50 MG tablet Take 1 tablet by mouth at bedtime. 03/10/15  Yes Donzetta Starch, NP  calcium-vitamin D (OSCAL WITH D) 500-200 MG-UNIT tablet Take 1 tablet by mouth daily with breakfast.    Historical Provider, MD  Multiple Vitamin (MULTIVITAMIN WITH MINERALS) TABS tablet Take 1 tablet by mouth daily.    Historical Provider, MD  Nutritional Supplements (NUTRITIONAL SUPPLEMENT PO) Take  120 mLs by mouth daily.    Historical Provider, MD  Omega-3 Fatty Acids (FISH OIL) 1000 MG CAPS Take 1,000 mg by mouth daily.    Historical Provider, MD    Physical Exam: Vitals:   04/17/16 1515 04/17/16 1545 04/17/16 1600 04/17/16 1630  BP: (!) 115/45 (!) 109/50 (!) 103/53 (!) 112/54  Pulse: 66 63 69 62  Resp: 19 17 22 21   Temp:      TempSrc:      SpO2: 99% 98% 98% 98%  Weight:      Height:          Constitutional: NAD, calm, comfortable Vitals:   04/17/16 1515 04/17/16 1545 04/17/16 1600 04/17/16 1630  BP: (!) 115/45 (!) 109/50 (!) 103/53 (!) 112/54  Pulse: 66 63 69 62  Resp: 19 17 22 21   Temp:      TempSrc:      SpO2: 99% 98% 98% 98%  Weight:      Height:       General:  Elderly thin frail appearing female Eyes: PERRL, lids and conjunctivae normal ENMT: Mucous membranes are moist. Posterior pharynx clear of any exudate or lesions. Poor dentition  Neck: normal, supple, no masses, no thyromegaly Respiratory: clear to auscultation bilaterally, no wheezing, no crackles. Normal respiratory effort. No accessory muscle use.  Cardiovascular: Regular rate and rhythm, III/VI systolic murmur, no rubs / gallops. No extremity edema. 2+ pedal pulses. No carotid bruits.  Abdomen: no tenderness, no masses palpated. No hepatosplenomegaly. Bowel sounds positive.  Musculoskeletal: no clubbing / cyanosis. No joint deformity upper and lower extremities. Good ROM, no contractures. Normal muscle tone.  Skin: no rashes, lesions, ulcers. No induration Neurologic: CN 2-12 grossly intact. Sensation intact, DTR normal. Strength 5/5 in all 4.  Psychiatric: Alert to self alone, no insight into recent events    Labs on Admission: I have personally reviewed following labs and imaging studies  CBC:  Recent Labs Lab 04/17/16 1141 04/17/16 1148  WBC 7.8  --   NEUTROABS 4.9  --   HGB 11.9* 11.9*  HCT 34.3* 35.0*  MCV 82.1  --   PLT 195  --    Basic Metabolic Panel:  Recent Labs Lab  04/17/16 1141 04/17/16 1148  NA 132* 133*  K 4.3 4.5  CL 99* 98*  CO2 26  --   GLUCOSE 94 93  BUN 14 17  CREATININE 0.78 0.80  CALCIUM 9.9  --    GFR: Estimated Creatinine Clearance: 37.7 mL/min (by C-G formula based on SCr of 0.8 mg/dL). Liver Function Tests:  Recent Labs Lab 04/17/16 1141  AST 25  ALT 16  ALKPHOS 49  BILITOT 1.1  PROT 6.7  ALBUMIN 3.7   No results for input(s): LIPASE, AMYLASE in the last 168 hours. No results for input(s): AMMONIA in the last 168 hours. Coagulation Profile:  Recent Labs Lab 04/17/16 1141  INR 1.00   Cardiac Enzymes: No results for input(s): CKTOTAL, CKMB, CKMBINDEX, TROPONINI in the last 168 hours. BNP (last 3 results) No results  for input(s): PROBNP in the last 8760 hours. HbA1C: No results for input(s): HGBA1C in the last 72 hours. CBG: No results for input(s): GLUCAP in the last 168 hours. Lipid Profile: No results for input(s): CHOL, HDL, LDLCALC, TRIG, CHOLHDL, LDLDIRECT in the last 72 hours. Thyroid Function Tests: No results for input(s): TSH, T4TOTAL, FREET4, T3FREE, THYROIDAB in the last 72 hours. Anemia Panel: No results for input(s): VITAMINB12, FOLATE, FERRITIN, TIBC, IRON, RETICCTPCT in the last 72 hours. Urine analysis:    Component Value Date/Time   COLORURINE YELLOW 04/17/2016 1329   APPEARANCEUR CLEAR 04/17/2016 1329   LABSPEC 1.007 04/17/2016 1329   PHURINE 7.5 04/17/2016 1329   GLUCOSEU NEGATIVE 04/17/2016 1329   HGBUR NEGATIVE 04/17/2016 1329   BILIRUBINUR NEGATIVE 04/17/2016 1329   KETONESUR NEGATIVE 04/17/2016 1329   PROTEINUR NEGATIVE 04/17/2016 1329   UROBILINOGEN 0.2 04/18/2007 1442   NITRITE NEGATIVE 04/17/2016 1329   LEUKOCYTESUR TRACE (A) 04/17/2016 1329   Sepsis Labs: !!!!!!!!!!!!!!!!!!!!!!!!!!!!!!!!!!!!!!!!!!!! @LABRCNTIP (procalcitonin:4,lacticidven:4) )No results found for this or any previous visit (from the past 240 hour(s)).   Radiological Exams on Admission: Ct Head Wo  Contrast  Result Date: 04/17/2016 CLINICAL DATA:  Dizziness status post subarachnoid hemorrhage EXAM: CT HEAD WITHOUT CONTRAST TECHNIQUE: Contiguous axial images were obtained from the base of the skull through the vertex without intravenous contrast. COMPARISON:  03/03/2015 FINDINGS: Brain: No evidence of acute infarction, extra-axial collection, ventriculomegaly, or mass effect. Small amount of hemorrhage in the high left parietal lobe sulcus consistent with subarachnoid hemorrhage. Generalized cerebral atrophy. Periventricular white matter low attenuation likely secondary to microangiopathy. Vascular: Cerebrovascular atherosclerotic calcifications are noted. Skull: Negative for fracture or focal lesion. Sinuses/Orbits: Visualized portions of the orbits are unremarkable. Visualized portions of the paranasal sinuses and mastoid air cells are unremarkable. Other: None. IMPRESSION: 1. Small volume subarachnoid hemorrhage in the high left parietal lobe. 2. Chronic microvascular disease and cerebral atrophy. Electronically Signed   By: Kathreen Devoid   On: 04/17/2016 13:35    EKG: Independently reviewed. Unchanged from previously. Slight ST elevation, left anterior fasicular block  Assessment/Plan Principal Problem:   ICH (intracerebral hemorrhage) (HCC) Active Problems:   Hyperlipidemia   Dementia   Essential hypertension   Glaucoma   Subarachnoid hemorrhage (HCC)     Intracerebral Hemorrhage s/p fall - neuro checks q4h - hold antiplatelet agents and NSAIDs - continue Keppra 500mg  BID - stable on repeat CT done today -PT/ OT - Telemetry - Urine Culture (patient having no symptoms so will not treat at this time)  Essential Hypertension - will continue lisinopril and amlodipine  Dementia - continue Donezipil  Glaucoma - Continue home eye drops     DVT prophylaxis: SCDs   Code Status: Full Code- verified with family bedside  Family Communication:  Family is bedside Disposition  Plan: Discharge to previous home environment likely tomorrow  Consults called: Neurology called by EDP- no need to see patient  Admission status: Observation telemetry    Loretha Stapler MD [facility] Pager [phone]  If 7PM-7AM, please contact night-coverage [email or web address] Password [facility]  04/17/2016, 5:36 PM

## 2016-04-17 NOTE — ED Notes (Signed)
Pt. Has been seen at [facility] and at [facility] for a fall and subarachnoid hemorrhage. Pt was discharge home to the care of the son. He reports that the pt was back to normal at discharge but today seemed as though she was having more trouble walking, looking as though she was going to fall backwards. Pt also c/o pain in her head and neck. Pt. NIH 2 based on confusion which is her baseline. Pt pleasantly confused. NAD. VSS. Sons phone number listed and he has been notified of room on floor.

## 2016-04-18 DIAGNOSIS — I10 Essential (primary) hypertension: Secondary | ICD-10-CM | POA: Diagnosis not present

## 2016-04-18 DIAGNOSIS — G309 Alzheimer's disease, unspecified: Secondary | ICD-10-CM | POA: Diagnosis not present

## 2016-04-18 DIAGNOSIS — I610 Nontraumatic intracerebral hemorrhage in hemisphere, subcortical: Secondary | ICD-10-CM | POA: Diagnosis not present

## 2016-04-18 DIAGNOSIS — F0280 Dementia in other diseases classified elsewhere without behavioral disturbance: Secondary | ICD-10-CM

## 2016-04-18 LAB — BASIC METABOLIC PANEL
Anion gap: 7 (ref 5–15)
BUN: 9 mg/dL (ref 6–20)
CHLORIDE: 101 mmol/L (ref 101–111)
CO2: 28 mmol/L (ref 22–32)
CREATININE: 0.79 mg/dL (ref 0.44–1.00)
Calcium: 9.7 mg/dL (ref 8.9–10.3)
GFR calc non Af Amer: >60 mL/min (ref >60)
Glucose, Bld: 95 mg/dL (ref 65–99)
Potassium: 4 mmol/L (ref 3.5–5.1)
Sodium: 136 mmol/L (ref 135–145)

## 2016-04-18 MED ORDER — LEVETIRACETAM 250 MG PO TABS: 250.0000 mg | ORAL_TABLET | Freq: Every day | ORAL | Status: DC

## 2016-04-18 NOTE — Evaluation (Signed)
Physical Therapy Evaluation Patient Details Name: Christine Kramer MRN: [id number] DOB: 12/13/1920 Today's Date: 04/18/2016   History of Present Illness  82 y.o.femalewith medical history significant of dementia, hypertension and glaucoma that is in [place] visiting family that experienced a fall on 04/15/16 and hit the side of her head and her neck. She underwent a CT scan at [facility] and was found to have a small subarachnoid hemorrhage. She was admitted to the hospital and she was deemed stable for discharge 12/3. Her son states that she was fine after discharge until 12/4 when she had difficulty ambulating from the bathroom and keep appearing as though she would fall backwards.     Clinical Impression  Pt admitted with above diagnosis. Patient confused and unable to determine her baseline (repeatedly states we need to ask her son questions because she is "cuckoo" since hitting her head). Son not present and not answering phone. Patient currently with fear of falling with posterior lean and required 2 person assist to walk 6 feet. Pt currently with functional limitations due to the deficits listed below (see PT Problem List).  Pt may benefit from skilled PT to increase their independence and safety with mobility to allow discharge to the venue listed below.       Follow Up Recommendations SNF;Supervision/Assistance - 24 hour (needs 24/7 due to confusion)    Equipment Recommendations  None recommended by PT (TBD)    Recommendations for Other Services       Precautions / Restrictions Precautions Precautions: Fall      Mobility  Bed Mobility                  Transfers Overall transfer level: Needs assistance Equipment used: 2 person hand held assist Transfers: Sit to/from Stand Sit to Stand: Min assist;+2 physical assistance;+2 safety/equipment         General transfer comment: pt initially to [facility] with OT; PT arrived with pt sit to stand x2  improving  Ambulation/Gait Ambulation/Gait assistance: Mod assist;+2 physical assistance;+2 safety/equipment Ambulation Distance (Feet): 6 Feet Assistive device: 2 person hand held assist Gait Pattern/deviations: Step-through pattern;Decreased stride length;Trunk flexed;Leaning posteriorly   Gait velocity interpretation: <1.8 ft/sec, indicative of risk for recurrent falls General Gait Details: attempted single UE support and pt began reaching for surfaces to steady herself  Stairs            Wheelchair Mobility    Modified Rankin (Stroke Patients Only)       Balance Overall balance assessment: Needs assistance;History of Falls         Standing balance support: Bilateral upper extremity supported Standing balance-Leahy Scale: Poor Standing balance comment: posterior lean                             Pertinent Vitals/Pain Pain Assessment: No/denies pain    Home Living Family/patient expects to be discharged to:: Unsure                 Additional Comments: pt confused; attempted call to her son and got voicemail    Prior Function           Comments: unknown     Hand Dominance        Extremity/Trunk Assessment   Upper Extremity Assessment: Defer to OT evaluation           Lower Extremity Assessment: Generalized weakness;Difficult to assess due to impaired cognition  Cervical / Trunk Assessment: Kyphotic  Communication      Cognition Arousal/Alertness: Awake/alert Behavior During Therapy: Anxious Overall Cognitive Status: No family/caregiver present to determine baseline cognitive functioning (with ?'s states "I don't know, you'll have to ask my son")                      General Comments      Exercises     Assessment/Plan    PT Assessment Patient needs continued PT services  PT Problem List Decreased strength;Decreased balance;Decreased mobility;Decreased cognition;Decreased knowledge of use of DME           PT Treatment Interventions DME instruction;Gait training;Stair training;Functional mobility training;Therapeutic activities;Balance training;Neuromuscular re-education;Cognitive remediation;Patient/family education    PT Goals (Current goals can be found in the Care Plan section)  Acute Rehab PT Goals Patient Stated Goal: unable to state; agrees to work on walking PT Goal Formulation: Patient unable to participate in goal setting Time For Goal Achievement: 05/02/16 Potential to Achieve Goals: Good    Frequency Min 3X/week   Barriers to discharge Decreased caregiver support unknown support in [place] vs [place]    Co-evaluation PT/OT/SLP Co-Evaluation/Treatment: Yes Reason for Co-Treatment: Complexity of the patient's impairments (multi-system involvement);For patient/therapist safety PT goals addressed during session: Mobility/safety with mobility;Balance         End of Session Equipment Utilized During Treatment: Gait belt Activity Tolerance: Patient tolerated treatment well Patient left: in chair;with call bell/phone within reach;with chair alarm set      Functional Assessment Tool Used: clinical judgement Functional Limitation: Mobility: Walking and moving around Mobility: Walking and Moving Around Current Status [id number]): At least 40 percent but less than 60 percent impaired, limited or restricted Mobility: Walking and Moving Around Goal Status [phone]): At least 1 percent but less than 20 percent impaired, limited or restricted    Time: [id number] PT Time Calculation (min) (ACUTE ONLY): 17 min   Charges:   PT Evaluation $PT Eval Moderate Complexity: 1 Procedure     PT G Codes:   PT G-Codes **NOT FOR INPATIENT CLASS** Functional Assessment Tool Used: clinical judgement Functional Limitation: Mobility: Walking and moving around Mobility: Walking and Moving Around Current Status [id number]): At least 40 percent but less than 60 percent impaired, limited or  restricted Mobility: Walking and Moving Around Goal Status [phone]): At least 1 percent but less than 20 percent impaired, limited or restricted    Rexanne Mano 04/18/2016, 12:11 PM Pager [phone]

## 2016-04-18 NOTE — Progress Notes (Signed)
Patient is discharged from room 5M06 at this time.t alert and in stable condition. IV site d/c'd and instructions read to patient and son with understanding verbalized. Left unit via wheelchair with family and all belongings at side.

## 2016-04-18 NOTE — Care Management Note (Signed)
Case Management Note  Patient Details  Name: Christine Kramer MRN: [id number] Date of Birth: 1920/10/30  Subjective/Objective:                    Action/Plan: Recommendations are for SNF. Pt with out of state insurance. CM spoke with patients son Christine Kramer and he is interested in patient going home with [facility] services. Christine Kramer is to come to hospital this afternoon. CM will meet with him and continue to monitor.   Expected Discharge Date:   (Pending)               Expected Discharge Plan:  [place]  In-House Referral:     Discharge planning Services     Post Acute Care Choice:    Choice offered to:  Patient, Adult Children Tay Clinesmith (son) [phone])  DME Arranged:    DME Agency:     HH Arranged:    [place] Agency:     Status of Service:  In process, will continue to follow  If discussed at Long Length of Stay Meetings, dates discussed:    Additional Comments:  Pollie Friar, RN 04/18/2016, 2:36 PM

## 2016-04-18 NOTE — Discharge Summary (Signed)
Physician Discharge Summary  Christine Kramer [id number] DOB: 1921/02/10 DOA: 04/17/2016  PCP: Pcp Not In System  Admit date: 04/17/2016 Discharge date: 04/18/2016  Time spent: 45 minutes  Recommendations for Outpatient Follow-up:  PCP in [place] in 1 week Stopped lisinopril due to soft blood pressures  Discharge Diagnoses:  Principal Problem:   Recent small subarachnoid hemorrhage   Recent fall   Generalized weakness   Hyperlipidemia   Dementia   Essential hypertension   Glaucoma   Subarachnoid hemorrhage ([place])   Discharge Condition: stable  Diet recommendation: heart healthy  Filed Weights   04/17/16 1119  Weight: 54.4 kg (120 lb)    History of present illness:   Christine Kramer is a 82 y.o. female with medical history significant of hypertension and glaucoma that is in [place] visiting family that experienced a fall on 04/15/16. She was taken to the [facility] and then transferred to [facility] for evaluation, found to have a small subarachnoid hemorrhage.   She was admitted to the hospital and was seen by neurosurgery on day of admission as well as the subsequent day of her admission discharged home on low dose Keppra for seizure prophylaxis. Her son stated that she was fine after discharge until 12/4  when she had difficulty ambulating from the bathroom and keep appearing as though she would fall backwards.  Hospital Course:   Recent subarachnoid hemorrhage  - CT head with small SAH - was started on low dose keppra for seizure prophylaxis 5days ago meant to be for 1 week, will stop this due to daytime lethargy -also BP running in low normal range, stopped lisinopril -PT/OT eval completed  Essential Hypertension - stopped lisinopril and resumed amlodipine  Dementia - continue Donezipil  Glaucoma - Continue home eye drops  Discharge Exam: Vitals:   04/18/16 0600 04/18/16 0916  BP: (!) 106/57 122/60  Pulse: 74 74  Resp: 18 18   Temp:  98.3 F (36.8 C)    General: AAOx3 Cardiovascular: S1S2/RRR Respiratory: CTAB  Discharge Instructions    Current Discharge Medication List    CONTINUE these medications which have NOT CHANGED   Details  amLODipine (NORVASC) 5 MG tablet TAKE 1 TABLET BY MOUTH DAILY Qty: 90 tablet, Refills: 0    donepezil (ARICEPT) 10 MG tablet TAKE 1 TABLET BY MOUTH AT BEDTIME Qty: 90 tablet, Refills: 0    latanoprost (XALATAN) 0.005 % ophthalmic solution Place 1 drop into both eyes at bedtime. Refills: 2    senna-docusate (SENOKOT-S) 8.6-50 MG tablet Take 1 tablet by mouth at bedtime. Qty: 30 tablet, Refills: 2    calcium-vitamin D (OSCAL WITH D) 500-200 MG-UNIT tablet Take 1 tablet by mouth daily with breakfast.    Multiple Vitamin (MULTIVITAMIN WITH MINERALS) TABS tablet Take 1 tablet by mouth daily.    Nutritional Supplements (NUTRITIONAL SUPPLEMENT PO) Take 120 mLs by mouth daily.    Omega-3 Fatty Acids (FISH OIL) 1000 MG CAPS Take 1,000 mg by mouth daily.      STOP taking these medications     levETIRAcetam (KEPPRA) 250 MG tablet      lisinopril (PRINIVIL,ZESTRIL) 20 MG tablet        No Known Allergies    The results of significant diagnostics from this hospitalization (including imaging, microbiology, ancillary and laboratory) are listed below for reference.    Significant Diagnostic Studies: Ct Head Wo Contrast  Result Date: 04/17/2016 CLINICAL DATA:  Dizziness status post subarachnoid hemorrhage EXAM: CT HEAD WITHOUT CONTRAST TECHNIQUE:  Contiguous axial images were obtained from the base of the skull through the vertex without intravenous contrast. COMPARISON:  03/03/2015 FINDINGS: Brain: No evidence of acute infarction, extra-axial collection, ventriculomegaly, or mass effect. Small amount of hemorrhage in the high left parietal lobe sulcus consistent with subarachnoid hemorrhage. Generalized cerebral atrophy. Periventricular white matter low attenuation likely  secondary to microangiopathy. Vascular: Cerebrovascular atherosclerotic calcifications are noted. Skull: Negative for fracture or focal lesion. Sinuses/Orbits: Visualized portions of the orbits are unremarkable. Visualized portions of the paranasal sinuses and mastoid air cells are unremarkable. Other: None. IMPRESSION: 1. Small volume subarachnoid hemorrhage in the high left parietal lobe. 2. Chronic microvascular disease and cerebral atrophy. Electronically Signed   By: Kathreen Devoid   On: 04/17/2016 13:35    Microbiology: No results found for this or any previous visit (from the past 240 hour(s)).   Labs: Basic Metabolic Panel:  Recent Labs Lab 04/17/16 1141 04/17/16 1148 04/18/16 0350  NA 132* 133* 136  K 4.3 4.5 4.0  CL 99* 98* 101  CO2 26  --  28  GLUCOSE 94 93 95  BUN 14 17 9   CREATININE 0.78 0.80 0.79  CALCIUM 9.9  --  9.7   Liver Function Tests:  Recent Labs Lab 04/17/16 1141  AST 25  ALT 16  ALKPHOS 49  BILITOT 1.1  PROT 6.7  ALBUMIN 3.7   No results for input(s): LIPASE, AMYLASE in the last 168 hours. No results for input(s): AMMONIA in the last 168 hours. CBC:  Recent Labs Lab 04/17/16 1141 04/17/16 1148  WBC 7.8  --   NEUTROABS 4.9  --   HGB 11.9* 11.9*  HCT 34.3* 35.0*  MCV 82.1  --   PLT 195  --    Cardiac Enzymes: No results for input(s): CKTOTAL, CKMB, CKMBINDEX, TROPONINI in the last 168 hours. BNP: BNP (last 3 results) No results for input(s): BNP in the last 8760 hours.  ProBNP (last 3 results) No results for input(s): PROBNP in the last 8760 hours.  CBG: No results for input(s): GLUCAP in the last 168 hours.     SignedDomenic Polite MD.  [facility] 04/18/2016, 1:08 PM

## 2016-04-18 NOTE — Evaluation (Signed)
Occupational Therapy Evaluation Patient Details Name: Christine Kramer MRN: [id number] DOB: 1921-04-02 Today's Date: 04/18/2016    History of Present Illness 82 y.o.femalewith medical history significant of dementia, hypertension and glaucoma that is in [place] visiting family that experienced a fall on 04/15/16 and hit the side of her head and her neck. She underwent a CT scan at [facility] and was found to have a small subarachnoid hemorrhage. She was admitted to the hospital and she was deemed stable for discharge 12/3. Her son states that she was fine after discharge until 12/4 when she had difficulty ambulating from the bathroom and keep appearing as though she would fall backwards.    Clinical Impression   Pt with poor memory and not able to offer information about her PLOF, home set up or availability of care at her son's home. Unable to reach son by phone. Presents with generalized weakness and poor balance interfering with ability to perform self care.  Pt is a high fall risk and will require very close supervision for all mobility and ADL upon discharge. Will follow acutely.  Follow Up Recommendations  SNF;Supervision/Assistance - 24 hour    Equipment Recommendations       Recommendations for Other Services       Precautions / Restrictions Precautions Precautions: Fall      Mobility Bed Mobility Overal bed mobility: Needs Assistance Bed Mobility: Supine to Sit     Supine to sit: Supervision;HOB elevated        Transfers Overall transfer level: Needs assistance Equipment used: 2 person hand held assist Transfers: Sit to/from Stand;Stand Pivot Transfers Sit to Stand: Min assist;+2 physical assistance;+2 safety/equipment Stand pivot transfers: Mod assist       General transfer comment: pt [facility] to [facility] with OT; PT arrived with pt sit to stand x2 improving    Balance Overall balance assessment: Needs assistance Sitting-balance support: Feet  supported Sitting balance-Leahy Scale: Good     Standing balance support: Bilateral upper extremity supported Standing balance-Leahy Scale: Poor Standing balance comment: posterior lean                            ADL Overall ADL's : Needs assistance/impaired Eating/Feeding: Set up;Sitting   Grooming: Wash/dry hands;Sitting;Set up   Upper Body Bathing: Minimal assitance;Sitting   Lower Body Bathing: Minimal assistance;Sit to/from stand   Upper Body Dressing : Minimal assistance;Sitting   Lower Body Dressing: Minimal assistance;Sit to/from stand   Toilet Transfer: Moderate assistance;Stand-pivot;BSC   Toileting- Clothing Manipulation and Hygiene: Minimal assistance;Sit to/from stand       Functional mobility during ADLs: +2 for physical assistance;Minimal assistance (B hand held)       Vision     Perception     Praxis      Pertinent Vitals/Pain Pain Assessment: No/denies pain     Hand Dominance Right   Extremity/Trunk Assessment Upper Extremity Assessment Upper Extremity Assessment: Generalized weakness   Lower Extremity Assessment Lower Extremity Assessment: Generalized weakness   Cervical / Trunk Assessment Cervical / Trunk Assessment: Kyphotic   Communication Communication Communication: HOH   Cognition Arousal/Alertness: Awake/alert Behavior During Therapy: Anxious Overall Cognitive Status: No family/caregiver present to determine baseline cognitive functioning (pt with hx of dementia)                     General Comments       Exercises       Shoulder Instructions  Home Living Family/patient expects to be discharged to:: Unsure                                 Additional Comments: pt confused; attempted call to her son and got voicemail      Prior Functioning/Environment          Comments: unknown        OT Problem List: Decreased strength;Decreased activity tolerance;Impaired balance  (sitting and/or standing);Decreased cognition;Decreased safety awareness;Decreased knowledge of use of DME or AE   OT Treatment/Interventions: Self-care/ADL training;DME and/or AE instruction;Therapeutic activities;Balance training;Patient/family education;Cognitive remediation/compensation    OT Goals(Current goals can be found in the care plan section) Acute Rehab OT Goals Patient Stated Goal: unable to state, son did not answer phone OT Goal Formulation: Patient unable to participate in goal setting Time For Goal Achievement: 05/02/16 Potential to Achieve Goals: Good ADL Goals Pt Will Perform Grooming: with supervision;standing Pt Will Perform Upper Body Bathing: with supervision;sitting Pt Will Perform Lower Body Bathing: with supervision;sit to/from stand Pt Will Perform Upper Body Dressing: with supervision;sitting Pt Will Perform Lower Body Dressing: with supervision;sit to/from stand Pt Will Transfer to Toilet: ambulating;with supervision;bedside commode Pt Will Perform Toileting - Clothing Manipulation and hygiene: with supervision;sit to/from stand  OT Frequency: Min 2X/week   Barriers to D/C:            Co-evaluation PT/OT/SLP Co-Evaluation/Treatment: Yes Reason for Co-Treatment: Complexity of the patient's impairments (multi-system involvement);For patient/therapist safety PT goals addressed during session: Mobility/safety with mobility;Balance OT goals addressed during session: ADL's and self-care      End of Session Equipment Utilized During Treatment: Gait belt  Activity Tolerance: Patient tolerated treatment well Patient left: in chair;with call bell/phone within reach;with chair alarm set   Time: 1100-1123 OT Time Calculation (min): 23 min Charges:  OT General Charges $OT Visit: 1 Procedure OT Evaluation $OT Eval Moderate Complexity: 1 Procedure G-Codes: OT G-codes **NOT FOR INPATIENT CLASS** Functional Assessment Tool Used: clinical judgement Functional  Limitation: Self care Self Care Current Status [id number]): At least 20 percent but less than 40 percent impaired, limited or restricted Self Care Goal Status [id number]): At least 1 percent but less than 20 percent impaired, limited or restricted  Malka So 04/18/2016, 12:49 PM  [phone]

## 2016-04-18 NOTE — Care Management Note (Signed)
Case Management Note  Patient Details  Name: JOZALYN WALKENHORST MRN: [id number] Date of Birth: 29-Oct-1920  Subjective/Objective:                    Action/Plan: Patient does not qualify for any [facility] services with her current out of state insurance. CM informed patients son and encouraged him to look into changing her insurance if she is going to be staying in [place]. CM also encouraged him into looking at ALF for his mother if this is a financial option. MD updated on inability to set up [facility] services. Pt's son states either he or his aunts are with Mrs Auclair 24/7 and have been providing her care. He also states she has DME at his apartment.   Expected Discharge Date:   (Pending)               Expected Discharge Plan:  [place]  In-House Referral:     Discharge planning Services     Post Acute Care Choice:    Choice offered to:  Patient, Adult Children Emeryn Stryjewski (son) [phone])  DME Arranged:    DME Agency:     HH Arranged:    [place] Agency:     Status of Service:  Completed, signed off  If discussed at [place] of Stay Meetings, dates discussed:    Additional Comments:  Pollie Friar, RN 04/18/2016, 4:42 PM

## 2016-04-20 LAB — URINE CULTURE: Culture: >=100000 — AB

## 2019-03-10 ENCOUNTER — Encounter (HOSPITAL_COMMUNITY): Payer: Self-pay

## 2019-03-10 ENCOUNTER — Other Ambulatory Visit: Payer: Self-pay

## 2019-03-10 ENCOUNTER — Inpatient Hospital Stay (HOSPITAL_COMMUNITY)
Admission: EM | Admit: 2019-03-10 | Discharge: 2019-03-21 | DRG: 390 | Disposition: A | Discharge status: Skilled Nursing Facility | Payer: Medicare Other | Attending: Internal Medicine | Admitting: Internal Medicine

## 2019-03-10 ENCOUNTER — Emergency Department (HOSPITAL_COMMUNITY): Payer: Medicare Other

## 2019-03-10 DIAGNOSIS — N3281 Overactive bladder: Secondary | ICD-10-CM | POA: Diagnosis present

## 2019-03-10 DIAGNOSIS — Z90710 Acquired absence of both cervix and uterus: Secondary | ICD-10-CM

## 2019-03-10 DIAGNOSIS — I10 Essential (primary) hypertension: Secondary | ICD-10-CM | POA: Diagnosis present

## 2019-03-10 DIAGNOSIS — Z0189 Encounter for other specified special examinations: Secondary | ICD-10-CM

## 2019-03-10 DIAGNOSIS — E785 Hyperlipidemia, unspecified: Secondary | ICD-10-CM | POA: Diagnosis present

## 2019-03-10 DIAGNOSIS — E7800 Pure hypercholesterolemia, unspecified: Secondary | ICD-10-CM | POA: Diagnosis present

## 2019-03-10 DIAGNOSIS — K56609 Unspecified intestinal obstruction, unspecified as to partial versus complete obstruction: Secondary | ICD-10-CM | POA: Diagnosis not present

## 2019-03-10 DIAGNOSIS — R062 Wheezing: Secondary | ICD-10-CM

## 2019-03-10 DIAGNOSIS — K862 Cyst of pancreas: Secondary | ICD-10-CM

## 2019-03-10 DIAGNOSIS — D136 Benign neoplasm of pancreas: Secondary | ICD-10-CM | POA: Diagnosis present

## 2019-03-10 DIAGNOSIS — H9190 Unspecified hearing loss, unspecified ear: Secondary | ICD-10-CM | POA: Diagnosis present

## 2019-03-10 DIAGNOSIS — E876 Hypokalemia: Secondary | ICD-10-CM | POA: Diagnosis not present

## 2019-03-10 DIAGNOSIS — F0390 Unspecified dementia without behavioral disturbance: Secondary | ICD-10-CM | POA: Diagnosis present

## 2019-03-10 DIAGNOSIS — Z20828 Contact with and (suspected) exposure to other viral communicable diseases: Secondary | ICD-10-CM | POA: Diagnosis present

## 2019-03-10 DIAGNOSIS — K219 Gastro-esophageal reflux disease without esophagitis: Secondary | ICD-10-CM | POA: Diagnosis present

## 2019-03-10 LAB — COMPREHENSIVE METABOLIC PANEL
ALT: 12 U/L (ref 0–44)
AST: 21 U/L (ref 15–41)
Albumin: 4.1 g/dL (ref 3.5–5.0)
Alkaline Phosphatase: 71 U/L (ref 38–126)
Anion gap: 8 (ref 5–15)
BUN: 16 mg/dL (ref 8–23)
CO2: 27 mmol/L (ref 22–32)
Calcium: 9.4 mg/dL (ref 8.9–10.3)
Chloride: 100 mmol/L (ref 98–111)
Creatinine, Ser: 0.8 mg/dL (ref 0.44–1.00)
GFR calc Af Amer: >60 mL/min (ref >60)
GFR calc non Af Amer: >60 mL/min (ref >60)
Glucose, Bld: 173 mg/dL — ABNORMAL HIGH (ref 70–99)
Potassium: 4 mmol/L (ref 3.5–5.1)
Sodium: 135 mmol/L (ref 135–145)
Total Bilirubin: 0.7 mg/dL (ref 0.3–1.2)
Total Protein: 7.7 g/dL (ref 6.5–8.1)

## 2019-03-10 LAB — LIPASE, BLOOD: Lipase: 27 U/L (ref 11–51)

## 2019-03-10 MED ORDER — ONDANSETRON HCL 4 MG/2ML IJ SOLN
4.0000 mg | Freq: Once | INTRAMUSCULAR | Status: AC
  Administered 2019-03-10: 4 mg via INTRAVENOUS
  Filled 2019-03-10: qty 2

## 2019-03-10 MED ORDER — SODIUM CHLORIDE 0.9 % IV BOLUS
500.0000 mL | Freq: Once | INTRAVENOUS | Status: AC
  Administered 2019-03-10: 500 mL via INTRAVENOUS

## 2019-03-10 NOTE — ED Triage Notes (Signed)
Per ems: Pt coming from home c/o emesis that started after dinner at 5:30pm. Multiple episodes since beginning. Orthostatic and poor skin turgor. Hx of dementia. 12 lead- right bundle branch block  cbg-180 130/70 sitting and standing 110/60 Hr-80 rr-18 96 room air   500 normal saline given by ems   20 L forearm

## 2019-03-10 NOTE — ED Provider Notes (Signed)
[facility] DEPT Provider Note   CSN: [id number] Arrival date & time: 03/10/19  2238     History   Chief Complaint Chief Complaint  Patient presents with  . Emesis    HPI Christine Kramer is a 85 y.o. female.     Patient is a 85 year old female with history of hypertension, hyperlipidemia, and dementia.  She is brought today by EMS for evaluation of vomiting.  Patient apparently started to vomit this evening after eating dinner.  Patient has no recollection of this and currently denies to me she is experiencing any discomfort or other symptoms.  She denies fevers or chills.  History was provided primarily through the EMS personnel.  From what I understand, the patient lives with her 7 year old sister and patient's son helps to look after the both of them.  The history is provided by the patient.  Emesis Severity:  Moderate Duration:  2 hours Timing:  Constant Quality:  Stomach contents Progression:  Unchanged Chronicity:  New Recent urination:  Normal Relieved by:  Nothing Worsened by:  Nothing Ineffective treatments:  None tried Associated symptoms: no abdominal pain, no chills, no diarrhea and no fever     Past Medical History:  Diagnosis Date  . Dementia ([place])   . High cholesterol   . Hypertension     Patient Active Problem List   Diagnosis Date Noted  . Subarachnoid hemorrhage ([place]) 04/17/2016  . Essential hypertension 03/16/2015  . OAB (overactive bladder) 03/16/2015  . Glaucoma 03/16/2015  . Hyperlipidemia 03/10/2015  . Dementia ([place]) 03/10/2015  . ICH (intracerebral hemorrhage) ([place]) 03/03/2015    Past Surgical History:  Procedure Laterality Date  . ABDOMINAL HYSTERECTOMY       OB History   No obstetric history on file.      Home Medications    Prior to Admission medications   Medication Sig Start Date End Date Taking? Authorizing Provider  amLODipine (NORVASC) 5 MG tablet TAKE 1 TABLET BY MOUTH DAILY 07/06/15    Lauree Chandler, NP  calcium-vitamin D (OSCAL WITH D) 500-200 MG-UNIT tablet Take 1 tablet by mouth daily with breakfast.    [provider]  donepezil (ARICEPT) 10 MG tablet TAKE 1 TABLET BY MOUTH AT BEDTIME 07/06/15   Lauree Chandler, NP  latanoprost (XALATAN) 0.005 % ophthalmic solution Place 1 drop into both eyes at bedtime. 01/21/15   [provider]  Multiple Vitamin (MULTIVITAMIN WITH MINERALS) TABS tablet Take 1 tablet by mouth daily.    [provider]  Nutritional Supplements (NUTRITIONAL SUPPLEMENT PO) Take 120 mLs by mouth daily.    [provider]  Omega-3 Fatty Acids (FISH OIL) 1000 MG CAPS Take 1,000 mg by mouth daily.    [provider]  senna-docusate (SENOKOT-S) 8.6-50 MG tablet Take 1 tablet by mouth at bedtime. 03/10/15   Donzetta Starch, NP    Family History No family history on file.  Social History Social History   Tobacco Use  . Smoking status: Never Smoker  . Smokeless tobacco: Never Used  Substance Use Topics  . Alcohol use: No  . Drug use: No     Allergies   Patient has no known allergies.   Review of Systems Review of Systems  Constitutional: Negative for chills and fever.  Gastrointestinal: Positive for vomiting. Negative for abdominal pain and diarrhea.  All other systems reviewed and are negative.    Physical Exam Updated Vital Signs SpO2 96%   Physical Exam Vitals signs  and nursing note reviewed.  Constitutional:      General: She is not in acute distress.    Appearance: She is well-developed. She is not diaphoretic.  HENT:     Head: Normocephalic and atraumatic.  Neck:     Musculoskeletal: Normal range of motion and neck supple.  Cardiovascular:     Rate and Rhythm: Normal rate and regular rhythm.     Heart sounds: No murmur. No friction rub. No gallop.   Pulmonary:     Effort: Pulmonary effort is normal. No respiratory distress.     Breath sounds: Normal breath sounds. No wheezing.   Abdominal:     General: Bowel sounds are normal. There is no distension.     Palpations: Abdomen is soft.     Tenderness: There is no abdominal tenderness.     Comments: Abdomen is somewhat tympanic to percussion.  Musculoskeletal: Normal range of motion.  Skin:    General: Skin is warm and dry.  Neurological:     Mental Status: She is alert and oriented to person, place, and time.      ED Treatments / Results  Labs (all labs ordered are listed, but only abnormal results are displayed) Labs Reviewed  COMPREHENSIVE METABOLIC PANEL  LIPASE, BLOOD  CBC WITH DIFFERENTIAL/PLATELET    EKG None  Radiology No results found.  Procedures Procedures (including critical care time)  Medications Ordered in ED Medications  sodium chloride 0.9 % bolus 500 mL (has no administration in time range)  ondansetron (ZOFRAN) injection 4 mg (has no administration in time range)     Initial Impression / Assessment and Plan / ED Course  I have reviewed the triage vital signs and the nursing notes.  Pertinent labs & imaging results that were available during my care of the patient were reviewed by me and considered in my medical decision making (see chart for details).  CT scan shows what appears to be a small bowel obstruction.  This was discussed with Dr. Lucia Gaskins from general surgery.  Patient to be admitted to the hospitalist service with consultation.  Dr. Alcario Drought agrees to admit.  If the patient continues to vomit, she will require an NG tube.  Final Clinical Impressions(s) / ED Diagnoses   Final diagnoses:  None    ED Discharge Orders    None       Veryl Speak, MD 03/11/19 [id number]

## 2019-03-11 ENCOUNTER — Encounter (HOSPITAL_COMMUNITY): Payer: Self-pay

## 2019-03-11 ENCOUNTER — Inpatient Hospital Stay (HOSPITAL_COMMUNITY): Payer: Medicare Other

## 2019-03-11 DIAGNOSIS — D136 Benign neoplasm of pancreas: Secondary | ICD-10-CM | POA: Diagnosis present

## 2019-03-11 DIAGNOSIS — N3281 Overactive bladder: Secondary | ICD-10-CM | POA: Diagnosis present

## 2019-03-11 DIAGNOSIS — F0280 Dementia in other diseases classified elsewhere without behavioral disturbance: Secondary | ICD-10-CM | POA: Diagnosis not present

## 2019-03-11 DIAGNOSIS — I10 Essential (primary) hypertension: Secondary | ICD-10-CM

## 2019-03-11 DIAGNOSIS — F0390 Unspecified dementia without behavioral disturbance: Secondary | ICD-10-CM | POA: Diagnosis present

## 2019-03-11 DIAGNOSIS — G309 Alzheimer's disease, unspecified: Secondary | ICD-10-CM | POA: Diagnosis not present

## 2019-03-11 DIAGNOSIS — K56609 Unspecified intestinal obstruction, unspecified as to partial versus complete obstruction: Secondary | ICD-10-CM | POA: Diagnosis present

## 2019-03-11 DIAGNOSIS — K219 Gastro-esophageal reflux disease without esophagitis: Secondary | ICD-10-CM | POA: Diagnosis present

## 2019-03-11 DIAGNOSIS — Z90710 Acquired absence of both cervix and uterus: Secondary | ICD-10-CM | POA: Diagnosis not present

## 2019-03-11 DIAGNOSIS — K862 Cyst of pancreas: Secondary | ICD-10-CM

## 2019-03-11 DIAGNOSIS — E876 Hypokalemia: Secondary | ICD-10-CM | POA: Diagnosis not present

## 2019-03-11 DIAGNOSIS — H9190 Unspecified hearing loss, unspecified ear: Secondary | ICD-10-CM | POA: Diagnosis present

## 2019-03-11 DIAGNOSIS — Z20828 Contact with and (suspected) exposure to other viral communicable diseases: Secondary | ICD-10-CM | POA: Diagnosis present

## 2019-03-11 DIAGNOSIS — E7800 Pure hypercholesterolemia, unspecified: Secondary | ICD-10-CM | POA: Diagnosis present

## 2019-03-11 DIAGNOSIS — E785 Hyperlipidemia, unspecified: Secondary | ICD-10-CM | POA: Diagnosis present

## 2019-03-11 LAB — CBC WITH DIFFERENTIAL/PLATELET
Abs Immature Granulocytes: 0.06 10*3/uL (ref 0.00–0.07)
Basophils Absolute: 0 10*3/uL (ref 0.0–0.1)
Basophils Relative: 0 %
Eosinophils Absolute: 0 10*3/uL (ref 0.0–0.5)
Eosinophils Relative: 0 %
HCT: 40.7 % (ref 36.0–46.0)
Hemoglobin: 13.7 g/dL (ref 12.0–15.0)
Immature Granulocytes: 0 %
Lymphocytes Relative: 5 %
Lymphs Abs: 0.8 10*3/uL (ref 0.7–4.0)
MCH: 28.7 pg (ref 26.0–34.0)
MCHC: 33.7 g/dL (ref 30.0–36.0)
MCV: 85.3 fL (ref 80.0–100.0)
Monocytes Absolute: 0.6 10*3/uL (ref 0.1–1.0)
Monocytes Relative: 4 %
Neutro Abs: 12.9 10*3/uL — ABNORMAL HIGH (ref 1.7–7.7)
Neutrophils Relative %: 91 %
Platelets: 153 10*3/uL (ref 150–400)
RBC: 4.77 MIL/uL (ref 3.87–5.11)
RDW: 12.8 % (ref 11.5–15.5)
WBC: 14.4 10*3/uL — ABNORMAL HIGH (ref 4.0–10.5)
nRBC: 0 % (ref 0.0–0.2)

## 2019-03-11 LAB — BASIC METABOLIC PANEL WITH GFR
Anion gap: 10 (ref 5–15)
BUN: 15 mg/dL (ref 8–23)
CO2: 24 mmol/L (ref 22–32)
Calcium: 9.4 mg/dL (ref 8.9–10.3)
Chloride: 101 mmol/L (ref 98–111)
Creatinine, Ser: 0.72 mg/dL (ref 0.44–1.00)
GFR calc Af Amer: >60 mL/min
GFR calc non Af Amer: >60 mL/min
Glucose, Bld: 166 mg/dL — ABNORMAL HIGH (ref 70–99)
Potassium: 3.9 mmol/L (ref 3.5–5.1)
Sodium: 135 mmol/L (ref 135–145)

## 2019-03-11 LAB — CBC
HCT: 46 % (ref 36.0–46.0)
Hemoglobin: 15.5 g/dL — ABNORMAL HIGH (ref 12.0–15.0)
MCH: 28.8 pg (ref 26.0–34.0)
MCHC: 33.7 g/dL (ref 30.0–36.0)
MCV: 85.3 fL (ref 80.0–100.0)
Platelets: 192 10*3/uL (ref 150–400)
RBC: 5.39 MIL/uL — ABNORMAL HIGH (ref 3.87–5.11)
RDW: 13 % (ref 11.5–15.5)
WBC: 15.9 10*3/uL — ABNORMAL HIGH (ref 4.0–10.5)
nRBC: 0 % (ref 0.0–0.2)

## 2019-03-11 LAB — SARS CORONAVIRUS 2 (TAT 6-24 HRS): SARS Coronavirus 2: NEGATIVE

## 2019-03-11 MED ORDER — ONDANSETRON HCL 4 MG/2ML IJ SOLN: 4.0000 mg | Freq: Four times a day (QID) | INTRAMUSCULAR | Status: DC | PRN

## 2019-03-11 MED ORDER — ASPIRIN 81 MG PO CHEW
81.0000 mg | CHEWABLE_TABLET | Freq: Every day | ORAL | Status: DC
  Administered 2019-03-11 – 2019-03-21 (×10): 81 mg via ORAL
  Filled 2019-03-11 (×10): qty 1

## 2019-03-11 MED ORDER — PROCHLORPERAZINE EDISYLATE 10 MG/2ML IJ SOLN
5.0000 mg | INTRAMUSCULAR | Status: DC | PRN
  Filled 2019-03-11: qty 2

## 2019-03-11 MED ORDER — MAGIC MOUTHWASH
15.0000 mL | Freq: Four times a day (QID) | ORAL | Status: DC | PRN
  Filled 2019-03-11: qty 15

## 2019-03-11 MED ORDER — ENOXAPARIN SODIUM 40 MG/0.4ML ~~LOC~~ SOLN
40.0000 mg | Freq: Every day | SUBCUTANEOUS | Status: DC
  Administered 2019-03-11 – 2019-03-21 (×11): 40 mg via SUBCUTANEOUS
  Filled 2019-03-11 (×12): qty 0.4

## 2019-03-11 MED ORDER — LATANOPROST 0.005 % OP SOLN
1.0000 [drp] | Freq: Every day | OPHTHALMIC | Status: DC
  Administered 2019-03-11 – 2019-03-20 (×10): 1 [drp] via OPHTHALMIC
  Filled 2019-03-11 (×2): qty 2.5

## 2019-03-11 MED ORDER — ACETAMINOPHEN 650 MG RE SUPP: 650.0000 mg | Freq: Four times a day (QID) | RECTAL | Status: DC | PRN

## 2019-03-11 MED ORDER — SODIUM CHLORIDE 0.9 % IV SOLN
8.0000 mg | Freq: Four times a day (QID) | INTRAVENOUS | Status: DC | PRN
  Filled 2019-03-11: qty 4

## 2019-03-11 MED ORDER — AMLODIPINE BESYLATE 5 MG PO TABS
5.0000 mg | ORAL_TABLET | Freq: Every day | ORAL | Status: DC
  Administered 2019-03-13 – 2019-03-21 (×9): 5 mg via ORAL
  Filled 2019-03-11 (×9): qty 1

## 2019-03-11 MED ORDER — BISACODYL 10 MG RE SUPP
10.0000 mg | Freq: Every day | RECTAL | Status: DC
  Administered 2019-03-12 – 2019-03-20 (×5): 10 mg via RECTAL
  Filled 2019-03-11 (×7): qty 1

## 2019-03-11 MED ORDER — ACETAMINOPHEN 325 MG PO TABS
650.0000 mg | ORAL_TABLET | Freq: Four times a day (QID) | ORAL | Status: DC | PRN
  Administered 2019-03-17 – 2019-03-21 (×4): 650 mg via ORAL
  Filled 2019-03-11 (×5): qty 2

## 2019-03-11 MED ORDER — LACTATED RINGERS IV BOLUS
1000.0000 mL | Freq: Once | INTRAVENOUS | Status: AC
  Administered 2019-03-11: 09:00:00 1000 mL via INTRAVENOUS

## 2019-03-11 MED ORDER — SODIUM CHLORIDE 0.9 % IV SOLN
INTRAVENOUS | Status: DC
  Administered 2019-03-11 – 2019-03-14 (×4): via INTRAVENOUS

## 2019-03-11 MED ORDER — ONDANSETRON HCL 4 MG PO TABS: 4.0000 mg | ORAL_TABLET | Freq: Four times a day (QID) | ORAL | Status: DC | PRN

## 2019-03-11 MED ORDER — DIATRIZOATE MEGLUMINE & SODIUM 66-10 % PO SOLN
90.0000 mL | Freq: Once | ORAL | Status: DC
  Filled 2019-03-11: qty 90

## 2019-03-11 MED ORDER — LIP MEDEX EX OINT
1.0000 "application " | TOPICAL_OINTMENT | Freq: Two times a day (BID) | CUTANEOUS | Status: DC
  Administered 2019-03-11 – 2019-03-21 (×19): 1 via TOPICAL
  Filled 2019-03-11 (×4): qty 7

## 2019-03-11 MED ORDER — DIATRIZOATE MEGLUMINE & SODIUM 66-10 % PO SOLN
90.0000 mL | Freq: Once | ORAL | Status: AC
  Administered 2019-03-11: 13:00:00 90 mL via ORAL
  Filled 2019-03-11 (×2): qty 90

## 2019-03-11 MED ORDER — LACTATED RINGERS IV BOLUS: 1000.0000 mL | Freq: Three times a day (TID) | INTRAVENOUS | Status: AC | PRN

## 2019-03-11 NOTE — Progress Notes (Signed)
Care started prior to midnight in the emergency room yesterday and she was admitted after midnight early this morning by my partner and colleague Dr. Theodis Blaze and I am in current agreement with assessment and plan done by him.  Additional changes to the plan of care been made accordingly.  The patient is a 85 year old female with a past medical history significant for hypertension, hyperlipidemia, hard of hearing, dementia and other comorbidities who presented to the hospital emergency room with a chief complaint of acute abdominal pain with sudden onset.  Patient is demented was unable to provide a subjective history the most of the history is obtained from the chart.  She had started complaining of some abdominal pain, nausea or vomiting after dinner but currently denies any abdominal pain and stated it felt like bloating.  Last normal bowel movement was yesterday.  In the ED a CT scan showed a small bowel obstruction with a transition point in the low midline abdomen and a 1.5 cm cystic mass that could be read in the pancreatic tail that represented a pancreatic serous cystadenoma.  White blood cell count was elevated in the ED and she has a prior surgical history of hysterectomy.  He was admitted for small bowel obstruction and started on IV fluids with normal saline at 75 mL's per hour.  General surgery recommended placing an NG tube and pursuing a small bowel protocol.  She is made n.p.o. and General surgery recommended NG tube decompression with a small bowel protocol as given her advanced age and dementia they are planning to try and hold off any surgery as possible.  We will continue to monitor patient's clinical response to intervention and repeat blood work and imaging in the a.m.

## 2019-03-11 NOTE — ED Notes (Signed)
ED TO INPATIENT HANDOFF REPORT  ED Nurse Name and Phone #: Shawna Clamp RN [phone]  S Name/Age/Gender Christine Kramer 85 y.o. female Room/Bed: WA29/WA29  Code Status   Code Status: Full Code  Home/SNF/Other Home Patient oriented to: self Is this baseline? Yes   Triage Complete: Triage complete  Chief Complaint emesis  Triage Note Per ems: Pt coming from home c/o emesis that started after dinner at 5:30pm. Multiple episodes since beginning. Orthostatic and poor skin turgor. Hx of dementia. 12 lead- right bundle branch block  cbg-180 130/70 sitting and standing 110/60 Hr-80 rr-18 96 room air   500 normal saline given by ems   20 L forearm   Allergies No Known Allergies  Level of Care/Admitting Diagnosis ED Disposition    ED Disposition Condition [facility] Area: [place] [[id number]]  Level of Care: Med-Surg [16]  Covid Evaluation: Asymptomatic Screening Protocol (No Symptoms)  Diagnosis: SBO (small bowel obstruction) [id number]  Admitting Physician: Doreatha Massed  Attending Physician: Etta Quill [phone]  Estimated length of stay: past midnight tomorrow  Certification:: I certify this patient will need inpatient services for at least 2 midnights  PT Class (Do Not Modify): Inpatient [101]  PT Acc Code (Do Not Modify): Private [1]       B Medical/Surgery History Past Medical History:  Diagnosis Date  . Dementia ([place])   . High cholesterol   . Hypertension    Past Surgical History:  Procedure Laterality Date  . ABDOMINAL HYSTERECTOMY       A IV Location/Drains/Wounds Patient Lines/Drains/Airways Status   Active Line/Drains/Airways    Name:   Placement date:   Placement time:   Site:   Days:   Peripheral IV 03/03/15 Right Forearm   03/03/15    1732    Forearm   1469   Peripheral IV 03/03/15 Left Forearm   03/03/15    1733    Forearm   1469   Peripheral IV 03/11/19 Left;Anterior Forearm   03/11/19     0658    Forearm   less than 1          Intake/Output Last 24 hours  Intake/Output Summary (Last 24 hours) at 03/11/2019 1552 Last data filed at 03/11/2019 1207 Gross per 24 hour  Intake 2000 ml  Output -  Net 2000 ml    Labs/Imaging Results for orders placed or performed during the hospital encounter of 03/10/19 (from the past 48 hour(s))  Comprehensive metabolic panel     Status: Abnormal   Collection Time: 03/10/19 10:58 PM  Result Value Ref Range   Sodium 135 135 - 145 mmol/L   Potassium 4.0 3.5 - 5.1 mmol/L   Chloride 100 98 - 111 mmol/L   CO2 27 22 - 32 mmol/L   Glucose, Bld 173 (H) 70 - 99 mg/dL   BUN 16 8 - 23 mg/dL   Creatinine, Ser 0.80 0.44 - 1.00 mg/dL   Calcium 9.4 8.9 - 10.3 mg/dL   Total Protein 7.7 6.5 - 8.1 g/dL   Albumin 4.1 3.5 - 5.0 g/dL   AST 21 15 - 41 U/L   ALT 12 0 - 44 U/L   Alkaline Phosphatase 71 38 - 126 U/L   Total Bilirubin 0.7 0.3 - 1.2 mg/dL   GFR calc non Af Amer >60 >60 mL/min   GFR calc Af Amer >60 >60 mL/min   Anion gap 8 5 - 15  Comment: Performed at [facility], [place] [address] [place], [place] [address]  Lipase, blood     Status: None   Collection Time: 03/10/19 10:58 PM  Result Value Ref Range   Lipase 27 11 - 51 U/L    Comment: Performed at [facility], [place] [address] [place], [place] [address]  CBC with Differential     Status: Abnormal   Collection Time: 03/10/19 10:58 PM  Result Value Ref Range   WBC 14.4 (H) 4.0 - 10.5 K/uL   RBC 4.77 3.87 - 5.11 MIL/uL   Hemoglobin 13.7 12.0 - 15.0 g/dL   HCT 40.7 36.0 - 46.0 %   MCV 85.3 80.0 - 100.0 fL   MCH 28.7 26.0 - 34.0 pg   MCHC 33.7 30.0 - 36.0 g/dL   RDW 12.8 11.5 - 15.5 %   Platelets 153 150 - 400 K/uL   nRBC 0.0 0.0 - 0.2 %   Neutrophils Relative % 91 %   Neutro Abs 12.9 (H) 1.7 - 7.7 K/uL   Lymphocytes Relative 5 %   Lymphs Abs 0.8 0.7 - 4.0 K/uL   Monocytes Relative 4 %   Monocytes Absolute 0.6 0.1 - 1.0 K/uL    Eosinophils Relative 0 %   Eosinophils Absolute 0.0 0.0 - 0.5 K/uL   Basophils Relative 0 %   Basophils Absolute 0.0 0.0 - 0.1 K/uL   Immature Granulocytes 0 %   Abs Immature Granulocytes 0.06 0.00 - 0.07 K/uL    Comment: Performed at [facility], [place] [address] [place], [place] [address]  SARS CORONAVIRUS 2 (TAT 6-24 HRS) Nasopharyngeal Nasopharyngeal Swab     Status: None   Collection Time: 03/11/19  1:41 AM   Specimen: Nasopharyngeal Swab  Result Value Ref Range   SARS Coronavirus 2 NEGATIVE NEGATIVE    Comment: (NOTE) SARS-CoV-2 target nucleic acids are NOT DETECTED. The SARS-CoV-2 RNA is generally detectable in upper and lower respiratory specimens during the acute phase of infection. Negative results do not preclude SARS-CoV-2 infection, do not rule out co-infections with other pathogens, and should not be used as the sole basis for treatment or other patient management decisions. Negative results must be combined with clinical observations, patient history, and epidemiological information. The expected result is Negative. Fact Sheet for Patients: SugarRoll.be Fact Sheet for Healthcare Providers: https://www.woods-mathews.com/ This test is not yet approved or cleared by the [place] FDA and  has been authorized for detection and/or diagnosis of SARS-CoV-2 by FDA under an Emergency Use Authorization (EUA). This EUA will remain  in effect (meaning this test can be used) for the duration of the COVID-19 declaration under Section 56 4(b)(1) of the Act, 21 U.S.C. section 360bbb-3(b)(1), unless the authorization is terminated or revoked sooner. Performed at [facility], [place] [address], [place], [place] [address]   CBC     Status: Abnormal   Collection Time: 03/11/19  8:01 AM  Result Value Ref Range   WBC 15.9 (H) 4.0 - 10.5 K/uL    Comment: WHITE COUNT CONFIRMED ON SMEAR   RBC 5.39 (H) 3.87 - 5.11  MIL/uL   Hemoglobin 15.5 (H) 12.0 - 15.0 g/dL   HCT 46.0 36.0 - 46.0 %   MCV 85.3 80.0 - 100.0 fL   MCH 28.8 26.0 - 34.0 pg   MCHC 33.7 30.0 - 36.0 g/dL   RDW 13.0 11.5 - 15.5 %   Platelets 192 150 - 400 K/uL   nRBC 0.0  0.0 - 0.2 %    Comment: Performed at [facility], [place] [address], [place], [place] [id number]  Basic metabolic panel     Status: Abnormal   Collection Time: 03/11/19  8:01 AM  Result Value Ref Range   Sodium 135 135 - 145 mmol/L   Potassium 3.9 3.5 - 5.1 mmol/L   Chloride 101 98 - 111 mmol/L   CO2 24 22 - 32 mmol/L   Glucose, Bld 166 (H) 70 - 99 mg/dL   BUN 15 8 - 23 mg/dL   Creatinine, Ser 0.72 0.44 - 1.00 mg/dL   Calcium 9.4 8.9 - 10.3 mg/dL   GFR calc non Af Amer >60 >60 mL/min   GFR calc Af Amer >60 >60 mL/min   Anion gap 10 5 - 15    Comment: Performed at [facility], [place] [address] [place], [place] [address]   Ct Abdomen Pelvis Wo Contrast  Result Date: 03/11/2019 CLINICAL DATA:  Nausea and vomiting EXAM: CT ABDOMEN AND PELVIS WITHOUT CONTRAST TECHNIQUE: Multidetector CT imaging of the abdomen and pelvis was performed following the standard protocol without IV contrast. COMPARISON:  None. FINDINGS: Lower chest: There is a trace right-sided pleural effusion.There are advanced mitral valve calcifications and coronary artery calcifications. Hepatobiliary: The liver is normal. Normal gallbladder.There is no biliary ductal dilation. Pancreas: There is a 1.5 x 1.5 cm cystic appearing structure that appears to be arising from the pancreatic tail. Spleen: No splenic laceration or hematoma. Adrenals/Urinary Tract: --Adrenal glands: No adrenal hemorrhage. --Right kidney/ureter: No hydronephrosis or perinephric hematoma. --Left kidney/ureter: There is a nonobstructing stone in the interpolar region of the left kidney. --Urinary bladder: The urinary bladder is distended. Stomach/Bowel: --Stomach/Duodenum: No hiatal hernia or other gastric  abnormality. Normal duodenal course and caliber. --Small bowel: There are dilated loops of small bowel measuring up to approximately 4 cm with air-fluid levels. There appears to be a transition point in the low midline abdomen (axial series 3, images 48 through 52). --Colon: Rectosigmoid diverticulosis without acute inflammation. --Appendix: Normal. Vascular/Lymphatic: Atherosclerotic calcification is present within the non-aneurysmal abdominal aorta, without hemodynamically significant stenosis. --No retroperitoneal lymphadenopathy. --No mesenteric lymphadenopathy. --No pelvic or inguinal lymphadenopathy. Reproductive: Unremarkable Other: No ascites or free air. The abdominal wall is normal. Musculoskeletal. No acute displaced fractures. IMPRESSION: 1. Small bowel obstruction with transition point in the low midline abdomen. 2. Trace right-sided pleural effusion. 3. A 1.5 cm cystic mass that appears to be arising from the pancreatic tail. 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 Aortic Atherosclerosis (ICD10-I70.0). Electronically Signed   By: Constance Holster M.D.   On: 03/11/2019 00:09    Pending Labs Unresulted Labs (From admission, onward)    Start     Ordered   03/12/19 0500  Comprehensive metabolic panel  Tomorrow morning,   R     03/11/19 1228   03/12/19 0500  CBC with Differential/Platelet  Tomorrow morning,   R     03/11/19 1228   03/12/19 0500  Magnesium  Tomorrow morning,   R     03/11/19 1228   03/12/19 0500  Phosphorus  Tomorrow morning,   R     03/11/19 1228          Vitals/Pain Today's Vitals   03/11/19 0830 03/11/19 1000 03/11/19 1036 03/11/19 1549  BP: 126/67 (!) 145/76 (!) 155/76 (!) 156/78  Pulse:   77 79  Resp:   20 14  Temp:    98.9 F (  37.2 C)  TempSrc:    Oral  SpO2:   98% 95%    Isolation Precautions No active isolations  Medications Medications  amLODipine (NORVASC) tablet 5 mg (5 mg Oral Not Given 03/11/19 1304)  aspirin chewable  tablet 81 mg (81 mg Oral Given 03/11/19 1216)  latanoprost (XALATAN) 0.005 % ophthalmic solution 1 drop (0 drops Both Eyes Hold 03/11/19 0317)  0.9 %  sodium chloride infusion ( Intravenous Stopped 03/11/19 1207)  acetaminophen (TYLENOL) tablet 650 mg (has no administration in time range)    Or  acetaminophen (TYLENOL) suppository 650 mg (has no administration in time range)  ondansetron (ZOFRAN) tablet 4 mg (has no administration in time range)  enoxaparin (LOVENOX) injection 40 mg (40 mg Subcutaneous Given 03/11/19 1239)  lactated ringers bolus 1,000 mL (has no administration in time range)  ondansetron (ZOFRAN) injection 4 mg (has no administration in time range)    Or  ondansetron (ZOFRAN) 8 mg in sodium chloride 0.9 % 50 mL IVPB (has no administration in time range)  prochlorperazine (COMPAZINE) injection 5-10 mg (has no administration in time range)  lip balm (CARMEX) ointment 1 application (1 application Topical Given 03/11/19 1217)  magic mouthwash (has no administration in time range)  bisacodyl (DULCOLAX) suppository 10 mg (10 mg Rectal Not Given 03/11/19 1226)  sodium chloride 0.9 % bolus 500 mL (0 mLs Intravenous Stopped 03/11/19 0010)  ondansetron (ZOFRAN) injection 4 mg (4 mg Intravenous Given 03/10/19 2306)  lactated ringers bolus 1,000 mL (0 mLs Intravenous Stopped 03/11/19 1207)  diatrizoate meglumine-sodium (GASTROGRAFIN) 66-10 % solution 90 mL (90 mLs Oral Given 03/11/19 1304)    Mobility walks with device High fall risk   Focused Assessments Neuro Assessment Handoff:  Swallow screen pass? Yes          Neuro Assessment: Within Defined Limits(at baseline ) Neuro Checks:      Last Documented NIHSS Modified Score:   Has TPA been given? No If patient is a Neuro Trauma and patient is going to OR before floor call report to [place] nurse: [phone] or [phone]     R Recommendations: See Admitting Provider Note  Report given to:   Additional  Notes: Pt has dementia at baseline and typically walks with walker. Pts sisters number in chart

## 2019-03-11 NOTE — ED Notes (Signed)
IV team established assess. Unable to obtain blood work. Phlebotomy needs to be contacted

## 2019-03-11 NOTE — ED Notes (Signed)
Pt pulled IV out 

## 2019-03-11 NOTE — ED Notes (Signed)
Lab called to come obtain 5am labs.

## 2019-03-11 NOTE — ED Notes (Signed)
Pts sister updated on plan of care.

## 2019-03-11 NOTE — ED Notes (Signed)
Xray aware that Gastrografin was administered at 1300

## 2019-03-11 NOTE — ED Notes (Signed)
Per Surgeon: Change contrast to PO and hold NG tube

## 2019-03-11 NOTE — ED Notes (Addendum)
Pt has been sleeping no episodes of vomiting report, per Lars Masson, RN  Hold NTG unless pt is  Having vomiting

## 2019-03-11 NOTE — ED Notes (Signed)
Pt removed both IVs.

## 2019-03-11 NOTE — ED Notes (Signed)
Lora [id number] sister- would like to be notified when transferred up to room

## 2019-03-11 NOTE — Consult Note (Signed)
[facility] Consult Note  [name] [birth year]  [id number].    Requesting MD: Jennette Kettle Chief Complaint/Reason for Consult: SBO  HPI:  Christine Kramer is a 85yo female PMH HTN and dementia who presented to [facility] last night complaining of acute onset abdominal pain. No family at bedside so most of the patient's history is taken from the chart. Apparently she started complaining of abdominal pain, nausea, and vomiting after eating dinner last night. She currently denies abdominal pain but states that she feels bloating. She thinks she had a normal BM yesterday, but she is unsure if she has passed any gas today.   ED workup included CT scan which shows small bowel obstruction with transition point in the low midline abdomen; a 1.5 cm cystic mass that appears to be arising from the pancreatic tail favored to represent a pancreatic serous cystadenoma. WBC 14.4. Covid negative. Patient was admitted to the medical service, general surgery asked to see.  Abdominal surgical history: hysterectomy Anticoagulants: none Lives at home with her sister, their son helps to look after both of them  ROS: Review of Systems  Unable to perform ROS: Dementia    No family history on file.  Past Medical History:  Diagnosis Date  . Dementia ([place])   . High cholesterol   . Hypertension     Past Surgical History:  Procedure Laterality Date  . ABDOMINAL HYSTERECTOMY      Social History:  reports that she has never smoked. She has never used smokeless tobacco. She reports that she does not drink alcohol or use drugs.  Allergies: No Known Allergies  (Not in a hospital admission)   Prior to Admission medications   Medication Sig Start Date End Date Taking? Authorizing Provider  amLODipine (NORVASC) 5 MG tablet TAKE 1 TABLET BY MOUTH DAILY 07/06/15  Yes Lauree Chandler, NP  aspirin 81 MG chewable tablet Chew 81 mg by mouth daily.   Yes [provider]  latanoprost (XALATAN)  0.005 % ophthalmic solution Place 1 drop into both eyes at bedtime. 01/21/15  Yes [provider]    Blood pressure (!) 162/78, pulse 80, temperature 97.9 F (36.6 C), temperature source Oral, resp. rate 16, SpO2 97 %. Physical Exam: General: pleasant, WD/WN AA female who is laying in bed in NAD HEENT: head is normocephalic, atraumatic.  Sclera are noninjected.  Pupils equal and round.  Ears and nose without any masses or lesions.  Mouth is pink and moist. Dentition fair Heart: regular, rate, and rhythm.  +murmurs.  Palpable pedal pulses bilaterally Lungs: CTAB, no wheezes, rhonchi, or rales noted.  Respiratory effort nonlabored Abd: soft, mild distension, nontender, hypoactive BS, no masses, hernias, or organomegaly MS: calves soft and nontender Skin: warm and dry with no masses, lesions, or rashes Psych: Alert and oriented only to self Neuro: cranial nerves grossly intact, extremity CSM intact bilaterally, normal speech  Results for orders placed or performed during the hospital encounter of 03/10/19 (from the past 48 hour(s))  Comprehensive metabolic panel     Status: Abnormal   Collection Time: 03/10/19 10:58 PM  Result Value Ref Range   Sodium 135 135 - 145 mmol/L   Potassium 4.0 3.5 - 5.1 mmol/L   Chloride 100 98 - 111 mmol/L   CO2 27 22 - 32 mmol/L   Glucose, Bld 173 (H) 70 - 99 mg/dL   BUN 16 8 - 23 mg/dL   Creatinine, Ser 0.80 0.44 - 1.00 mg/dL   Calcium  9.4 8.9 - 10.3 mg/dL   Total Protein 7.7 6.5 - 8.1 g/dL   Albumin 4.1 3.5 - 5.0 g/dL   AST 21 15 - 41 U/L   ALT 12 0 - 44 U/L   Alkaline Phosphatase 71 38 - 126 U/L   Total Bilirubin 0.7 0.3 - 1.2 mg/dL   GFR calc non Af Amer >60 >60 mL/min   GFR calc Af Amer >60 >60 mL/min   Anion gap 8 5 - 15    Comment: Performed at [facility], [place] [address], [place], [place] [address]  Lipase, blood     Status: None   Collection Time: 03/10/19 10:58 PM  Result Value Ref Range   Lipase 27 11 - 51 U/L     Comment: Performed at [facility], [place] [address] [place], [place] [address]  CBC with Differential     Status: Abnormal   Collection Time: 03/10/19 10:58 PM  Result Value Ref Range   WBC 14.4 (H) 4.0 - 10.5 K/uL   RBC 4.77 3.87 - 5.11 MIL/uL   Hemoglobin 13.7 12.0 - 15.0 g/dL   HCT 40.7 36.0 - 46.0 %   MCV 85.3 80.0 - 100.0 fL   MCH 28.7 26.0 - 34.0 pg   MCHC 33.7 30.0 - 36.0 g/dL   RDW 12.8 11.5 - 15.5 %   Platelets 153 150 - 400 K/uL   nRBC 0.0 0.0 - 0.2 %   Neutrophils Relative % 91 %   Neutro Abs 12.9 (H) 1.7 - 7.7 K/uL   Lymphocytes Relative 5 %   Lymphs Abs 0.8 0.7 - 4.0 K/uL   Monocytes Relative 4 %   Monocytes Absolute 0.6 0.1 - 1.0 K/uL   Eosinophils Relative 0 %   Eosinophils Absolute 0.0 0.0 - 0.5 K/uL   Basophils Relative 0 %   Basophils Absolute 0.0 0.0 - 0.1 K/uL   Immature Granulocytes 0 %   Abs Immature Granulocytes 0.06 0.00 - 0.07 K/uL    Comment: Performed at [facility], [place] [address] [place], [place] [address]  SARS CORONAVIRUS 2 (TAT 6-24 HRS) Nasopharyngeal Nasopharyngeal Swab     Status: None   Collection Time: 03/11/19  1:41 AM   Specimen: Nasopharyngeal Swab  Result Value Ref Range   SARS Coronavirus 2 NEGATIVE NEGATIVE    Comment: (NOTE) SARS-CoV-2 target nucleic acids are NOT DETECTED. The SARS-CoV-2 RNA is generally detectable in upper and lower respiratory specimens during the acute phase of infection. Negative results do not preclude SARS-CoV-2 infection, do not rule out co-infections with other pathogens, and should not be used as the sole basis for treatment or other patient management decisions. Negative results must be combined with clinical observations, patient history, and epidemiological information. The expected result is Negative. Fact Sheet for Patients: SugarRoll.be Fact Sheet for Healthcare Providers: https://www.woods-mathews.com/ This test  is not yet approved or cleared by the [place] FDA and  has been authorized for detection and/or diagnosis of SARS-CoV-2 by FDA under an Emergency Use Authorization (EUA). This EUA will remain  in effect (meaning this test can be used) for the duration of the COVID-19 declaration under Section 56 4(b)(1) of the Act, 21 U.S.C. section 360bbb-3(b)(1), unless the authorization is terminated or revoked sooner. Performed at [facility], [place] [address], [place], [place] [address]    Ct Abdomen Pelvis Wo Contrast  Result Date: 03/11/2019 CLINICAL DATA:  Nausea and vomiting EXAM: CT ABDOMEN AND PELVIS WITHOUT  CONTRAST TECHNIQUE: Multidetector CT imaging of the abdomen and pelvis was performed following the standard protocol without IV contrast. COMPARISON:  None. FINDINGS: Lower chest: There is a trace right-sided pleural effusion.There are advanced mitral valve calcifications and coronary artery calcifications. Hepatobiliary: The liver is normal. Normal gallbladder.There is no biliary ductal dilation. Pancreas: There is a 1.5 x 1.5 cm cystic appearing structure that appears to be arising from the pancreatic tail. Spleen: No splenic laceration or hematoma. Adrenals/Urinary Tract: --Adrenal glands: No adrenal hemorrhage. --Right kidney/ureter: No hydronephrosis or perinephric hematoma. --Left kidney/ureter: There is a nonobstructing stone in the interpolar region of the left kidney. --Urinary bladder: The urinary bladder is distended. Stomach/Bowel: --Stomach/Duodenum: No hiatal hernia or other gastric abnormality. Normal duodenal course and caliber. --Small bowel: There are dilated loops of small bowel measuring up to approximately 4 cm with air-fluid levels. There appears to be a transition point in the low midline abdomen (axial series 3, images 48 through 52). --Colon: Rectosigmoid diverticulosis without acute inflammation. --Appendix: Normal. Vascular/Lymphatic: Atherosclerotic calcification  is present within the non-aneurysmal abdominal aorta, without hemodynamically significant stenosis. --No retroperitoneal lymphadenopathy. --No mesenteric lymphadenopathy. --No pelvic or inguinal lymphadenopathy. Reproductive: Unremarkable Other: No ascites or free air. The abdominal wall is normal. Musculoskeletal. No acute displaced fractures. IMPRESSION: 1. Small bowel obstruction with transition point in the low midline abdomen. 2. Trace right-sided pleural effusion. 3. A 1.5 cm cystic mass that appears to be arising from the pancreatic tail. 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 Aortic Atherosclerosis (ICD10-I70.0). Electronically Signed   By: Constance Holster M.D.   On: 03/11/2019 00:09      Assessment/Plan HTN Dementia Pancreatic tail cystic mass, ?pancreatic serous cystadenoma  SBO - Patient with prior history of hysterectomy and CT scan findings of SBO with transition point in the low midline abdomen. Agree with medical admission. Will start small bowel protocol with NG tube placement for decompression, gastrograffin administration and delayed abdominal film. Encourage mobilization. We will follow.  ID - none VTE - SCDs, lovenox FEN - IVF, NPO/NGT Foley - none Follow up - TBD  [place], [facility] 03/11/2019, 7:43 AM Please see Amion for pager number during day hours 7:00am-4:30pm

## 2019-03-11 NOTE — ED Notes (Signed)
IV team at bedside 

## 2019-03-11 NOTE — H&P (Signed)
History and Physical    Christine Kramer [id number] DOB: [birth year] DOA: 03/10/2019  PCP: System, Pcp Not In  Patient coming from: Home  I have personally briefly reviewed patient's old medical records in [place]  Chief Complaint: N/V  HPI: Christine Kramer is a 85 y.o. female with medical history significant of Dementia, HTN, hard of hearing.  Patient presents to the ED with c/o vomiting.  Vomiting onset this evening after eating dinner at 530 pm.  Due to dementia, patient doesn't really remember any of this.  Currently denying abd pain, fevers, chills, etc.  Patient lives with 60 year old sister (who is at bedside), son looks after both of them.   ED Course: CT shows SBO.   Review of Systems: As per HPI, otherwise all review of systems negative.  Past Medical History:  Diagnosis Date   Dementia ([place])    High cholesterol    Hypertension     Past Surgical History:  Procedure Laterality Date   ABDOMINAL HYSTERECTOMY       reports that she has never smoked. She has never used smokeless tobacco. She reports that she does not drink alcohol or use drugs.  No Known Allergies  No family history on file. No sick contacts.  Prior to Admission medications   Medication Sig Start Date End Date Taking? Authorizing Provider  amLODipine (NORVASC) 5 MG tablet TAKE 1 TABLET BY MOUTH DAILY 07/06/15  Yes Lauree Chandler, NP  aspirin 81 MG chewable tablet Chew 81 mg by mouth daily.   Yes [provider]  latanoprost (XALATAN) 0.005 % ophthalmic solution Place 1 drop into both eyes at bedtime. 01/21/15  Yes [provider]    Physical Exam: Vitals:   03/10/19 2247 03/10/19 2300  BP:  (!) 173/77  Pulse:  74  Resp:  19  Temp:  97.9 F (36.6 C)  TempSrc:  Oral  SpO2: 96% 97%    Constitutional: NAD, calm, comfortable Eyes: PERRL, lids and conjunctivae normal ENMT: Mucous membranes are moist. Posterior pharynx clear of any exudate or  lesions.Normal dentition.  Neck: normal, supple, no masses, no thyromegaly Respiratory: clear to auscultation bilaterally, no wheezing, no crackles. Normal respiratory effort. No accessory muscle use.  Cardiovascular: Regular rate and rhythm, no murmurs / rubs / gallops. No extremity edema. 2+ pedal pulses. No carotid bruits.  Abdomen: no tenderness, no masses palpated. No hepatosplenomegaly. Bowel sounds positive.  Musculoskeletal: no clubbing / cyanosis. No joint deformity upper and lower extremities. Good ROM, no contractures. Normal muscle tone.  Skin: no rashes, lesions, ulcers. No induration Neurologic: CN 2-12 grossly intact. Sensation intact, DTR normal. Strength 5/5 in all 4.  Psychiatric: Normal judgment and insight. Alert and oriented x 3. Normal mood.    Labs on Admission: I have personally reviewed following labs and imaging studies  CBC: Recent Labs  Lab 03/10/19 2258  WBC 14.4*  NEUTROABS 12.9*  HGB 13.7  HCT 40.7  MCV 85.3  PLT 0000000   Basic Metabolic Panel: Recent Labs  Lab 03/10/19 2258  NA 135  K 4.0  CL 100  CO2 27  GLUCOSE 173*  BUN 16  CREATININE 0.80  CALCIUM 9.4   GFR: CrCl cannot be calculated (Unknown ideal weight.). Liver Function Tests: Recent Labs  Lab 03/10/19 2258  AST 21  ALT 12  ALKPHOS 71  BILITOT 0.7  PROT 7.7  ALBUMIN 4.1   Recent Labs  Lab 03/10/19 2258  LIPASE 27   No results  for input(s): AMMONIA in the last 168 hours. Coagulation Profile: No results for input(s): INR, PROTIME in the last 168 hours. Cardiac Enzymes: No results for input(s): CKTOTAL, CKMB, CKMBINDEX, TROPONINI in the last 168 hours. BNP (last 3 results) No results for input(s): PROBNP in the last 8760 hours. HbA1C: No results for input(s): HGBA1C in the last 72 hours. CBG: No results for input(s): GLUCAP in the last 168 hours. Lipid Profile: No results for input(s): CHOL, HDL, LDLCALC, TRIG, CHOLHDL, LDLDIRECT in the last 72 hours. Thyroid  Function Tests: No results for input(s): TSH, T4TOTAL, FREET4, T3FREE, THYROIDAB in the last 72 hours. Anemia Panel: No results for input(s): VITAMINB12, FOLATE, FERRITIN, TIBC, IRON, RETICCTPCT in the last 72 hours. Urine analysis:    Component Value Date/Time   COLORURINE YELLOW 04/17/2016 1329   APPEARANCEUR CLEAR 04/17/2016 1329   LABSPEC 1.007 04/17/2016 1329   PHURINE 7.5 04/17/2016 1329   GLUCOSEU NEGATIVE 04/17/2016 1329   HGBUR NEGATIVE 04/17/2016 1329   BILIRUBINUR NEGATIVE 04/17/2016 1329   KETONESUR NEGATIVE 04/17/2016 1329   PROTEINUR NEGATIVE 04/17/2016 1329   UROBILINOGEN 0.2 04/18/2007 1442   NITRITE NEGATIVE 04/17/2016 1329   LEUKOCYTESUR TRACE (A) 04/17/2016 1329    Radiological Exams on Admission: Ct Abdomen Pelvis Wo Contrast  Result Date: 03/11/2019 CLINICAL DATA:  Nausea and vomiting EXAM: CT ABDOMEN AND PELVIS WITHOUT CONTRAST TECHNIQUE: Multidetector CT imaging of the abdomen and pelvis was performed following the standard protocol without IV contrast. COMPARISON:  None. FINDINGS: Lower chest: There is a trace right-sided pleural effusion.There are advanced mitral valve calcifications and coronary artery calcifications. Hepatobiliary: The liver is normal. Normal gallbladder.There is no biliary ductal dilation. Pancreas: There is a 1.5 x 1.5 cm cystic appearing structure that appears to be arising from the pancreatic tail. Spleen: No splenic laceration or hematoma. Adrenals/Urinary Tract: --Adrenal glands: No adrenal hemorrhage. --Right kidney/ureter: No hydronephrosis or perinephric hematoma. --Left kidney/ureter: There is a nonobstructing stone in the interpolar region of the left kidney. --Urinary bladder: The urinary bladder is distended. Stomach/Bowel: --Stomach/Duodenum: No hiatal hernia or other gastric abnormality. Normal duodenal course and caliber. --Small bowel: There are dilated loops of small bowel measuring up to approximately 4 cm with air-fluid levels.  There appears to be a transition point in the low midline abdomen (axial series 3, images 48 through 52). --Colon: Rectosigmoid diverticulosis without acute inflammation. --Appendix: Normal. Vascular/Lymphatic: Atherosclerotic calcification is present within the non-aneurysmal abdominal aorta, without hemodynamically significant stenosis. --No retroperitoneal lymphadenopathy. --No mesenteric lymphadenopathy. --No pelvic or inguinal lymphadenopathy. Reproductive: Unremarkable Other: No ascites or free air. The abdominal wall is normal. Musculoskeletal. No acute displaced fractures. IMPRESSION: 1. Small bowel obstruction with transition point in the low midline abdomen. 2. Trace right-sided pleural effusion. 3. A 1.5 cm cystic mass that appears to be arising from the pancreatic tail. 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 Aortic Atherosclerosis (ICD10-I70.0). Electronically Signed   By: Constance Holster M.D.   On: 03/11/2019 00:09    EKG: Independently reviewed.  Assessment/Plan Principal Problem:   SBO (small bowel obstruction) (HCC) Active Problems:   Dementia ([place])   Essential hypertension    1. SBO - 1. NPO 2. IVF: NS at 77 3. Trying to get away without NGT to start with, put in NGT if any further N/V or abd pain 4. EDP spoke with gen surg: call them back in AM if formal consult wanted. 5. Repeat BMP in AM 2. Dementia - chronic and stable 3. HTN - continue  Norvasc  DVT prophylaxis: Lovenox Code Status: Full Family Communication: Sister at bedside Disposition Plan: Home after admit Consults called: EDP spoke with gen surg. Admission status: Admit to inpatient  Severity of Illness: The appropriate patient status for this patient is INPATIENT. Inpatient status is judged to be reasonable and necessary in order to provide the required intensity of service to ensure the patient's safety. The patient's presenting symptoms, physical exam findings, and initial  radiographic and laboratory data in the context of their chronic comorbidities is felt to place them at high risk for further clinical deterioration. Furthermore, it is not anticipated that the patient will be medically stable for discharge from the hospital within 2 midnights of admission. The following factors support the patient status of inpatient.   IP status for treatment of SBO.   * I certify that at the point of admission it is my clinical judgment that the patient will require inpatient hospital care spanning beyond 2 midnights from the point of admission due to high intensity of service, high risk for further deterioration and high frequency of surveillance required.*    Olusegun Gerstenberger M. DO [facility]  How to contact the [facility] Attending or Consulting provider [place] or covering provider during after hours [place], for this patient?  1. Check the care team in [facility] and look for a) attending/consulting [facility] provider listed and b) the [facility] team listed 2. Log into [email or web address]  Amion Physician Scheduling and messaging for groups and whole hospitals  On call and physician scheduling software for group practices, residents, hospitalists and other medical providers for call, clinic, rotation and shift schedules. OnCall [employer] is a hospital-wide system for scheduling doctors and paging doctors on call. EasyPlot is for scientific plotting and data analysis.  [email or web address]  and use [place]'s universal password to access. If you do not have the password, please contact the hospital operator.  3. Locate the [facility] provider you are looking for under [facility] and page to a number that you can be directly reached. 4. If you still have difficulty reaching the provider, please page the [facility] (Director on Call) for the Hospitalists listed on amion for assistance.  03/11/2019, 1:01 AM

## 2019-03-12 ENCOUNTER — Inpatient Hospital Stay (HOSPITAL_COMMUNITY): Payer: Medicare Other

## 2019-03-12 LAB — MAGNESIUM: Magnesium: 2.3 mg/dL (ref 1.7–2.4)

## 2019-03-12 LAB — COMPREHENSIVE METABOLIC PANEL
ALT: 10 U/L (ref 0–44)
AST: 15 U/L (ref 15–41)
Albumin: 3.5 g/dL (ref 3.5–5.0)
Alkaline Phosphatase: 60 U/L (ref 38–126)
Anion gap: 11 (ref 5–15)
BUN: 19 mg/dL (ref 8–23)
CO2: 26 mmol/L (ref 22–32)
Calcium: 9.1 mg/dL (ref 8.9–10.3)
Chloride: 102 mmol/L (ref 98–111)
Creatinine, Ser: 0.78 mg/dL (ref 0.44–1.00)
GFR calc Af Amer: >60 mL/min (ref >60)
GFR calc non Af Amer: >60 mL/min (ref >60)
Glucose, Bld: 162 mg/dL — ABNORMAL HIGH (ref 70–99)
Potassium: 4 mmol/L (ref 3.5–5.1)
Sodium: 139 mmol/L (ref 135–145)
Total Bilirubin: 1 mg/dL (ref 0.3–1.2)
Total Protein: 6.8 g/dL (ref 6.5–8.1)

## 2019-03-12 LAB — CBC WITH DIFFERENTIAL/PLATELET
Abs Immature Granulocytes: 0.03 10*3/uL (ref 0.00–0.07)
Basophils Absolute: 0 10*3/uL (ref 0.0–0.1)
Basophils Relative: 0 %
Eosinophils Absolute: 0 10*3/uL (ref 0.0–0.5)
Eosinophils Relative: 0 %
HCT: 45.2 % (ref 36.0–46.0)
Hemoglobin: 15.4 g/dL — ABNORMAL HIGH (ref 12.0–15.0)
Immature Granulocytes: 0 %
Lymphocytes Relative: 10 %
Lymphs Abs: 0.9 10*3/uL (ref 0.7–4.0)
MCH: 28.4 pg (ref 26.0–34.0)
MCHC: 34.1 g/dL (ref 30.0–36.0)
MCV: 83.2 fL (ref 80.0–100.0)
Monocytes Absolute: 1.3 10*3/uL — ABNORMAL HIGH (ref 0.1–1.0)
Monocytes Relative: 15 %
Neutro Abs: 6.1 10*3/uL (ref 1.7–7.7)
Neutrophils Relative %: 75 %
Platelets: 199 10*3/uL (ref 150–400)
RBC: 5.43 MIL/uL — ABNORMAL HIGH (ref 3.87–5.11)
RDW: 12.9 % (ref 11.5–15.5)
WBC: 8.3 10*3/uL (ref 4.0–10.5)
nRBC: 0 % (ref 0.0–0.2)

## 2019-03-12 LAB — PHOSPHORUS: Phosphorus: 3.7 mg/dL (ref 2.5–4.6)

## 2019-03-12 MED ORDER — MENTHOL 3 MG MT LOZG: 1.0000 | LOZENGE | OROMUCOSAL | Status: DC | PRN

## 2019-03-12 MED ORDER — PHENOL 1.4 % MT LIQD: 1.0000 | OROMUCOSAL | Status: DC | PRN

## 2019-03-12 MED ORDER — SIMETHICONE 40 MG/0.6ML PO SUSP
40.0000 mg | Freq: Four times a day (QID) | ORAL | Status: DC | PRN
  Filled 2019-03-12: qty 0.6

## 2019-03-12 MED ORDER — METHOCARBAMOL 1000 MG/10ML IJ SOLN
1000.0000 mg | Freq: Four times a day (QID) | INTRAVENOUS | Status: DC | PRN
  Filled 2019-03-12: qty 10

## 2019-03-12 MED ORDER — HYDROCORTISONE (PERIANAL) 2.5 % EX CREA: 1.0000 "application " | TOPICAL_CREAM | Freq: Four times a day (QID) | CUTANEOUS | Status: DC | PRN

## 2019-03-12 MED ORDER — ALUM & MAG HYDROXIDE-SIMETH 200-200-20 MG/5ML PO SUSP: 30.0000 mL | Freq: Four times a day (QID) | ORAL | Status: DC | PRN

## 2019-03-12 MED ORDER — DIATRIZOATE MEGLUMINE & SODIUM 66-10 % PO SOLN
90.0000 mL | Freq: Once | ORAL | Status: AC
  Administered 2019-03-12: 90 mL via NASOGASTRIC
  Filled 2019-03-12: qty 90

## 2019-03-12 MED ORDER — HYDROCORTISONE 1 % EX CREA: 1.0000 "application " | TOPICAL_CREAM | Freq: Three times a day (TID) | CUTANEOUS | Status: DC | PRN

## 2019-03-12 MED ORDER — GUAIFENESIN-DM 100-10 MG/5ML PO SYRP: 10.0000 mL | ORAL_SOLUTION | ORAL | Status: DC | PRN

## 2019-03-12 NOTE — Progress Notes (Signed)
PROGRESS NOTE    Christine Kramer  [id number] DOB: [birth year] DOA: 03/10/2019 PCP: System, Pcp Not In   Brief Narrative:  85 year old with past medical history of HTN, HLD, GERD, dementia came to the hospital with acute abdominal pain.  CT was consistent with small bowel obstruction with transition point in the low mid abdomen, 1.5 cm cystic mass in the pancreatic tail.  Past surgical history of hysterectomy.  General surgery recommended NG tube.  Small bowel protocol.   Assessment & Plan:   Principal Problem:   SBO (small bowel obstruction) (HCC) Active Problems:   Dementia (HCC)   Essential hypertension   Cystic mass of tail pancreas  Small bowel obstruction, acute-conservative management at this time -NG tube for decompression.  NPO.  Supportive care.  IV fluids.  Monitor urine output. -General surgery following. -Out of bed to chair.  Dementia-chronic and stable  Essential hypertension-continue Norvasc   DVT prophylaxis: Lovenox Code Status: Full Family Communication: None at bedside Disposition Plan: Maintain hospital stay until small bowel obstruction has resolved.  Consultants:   General surgery  Procedures:   None  Antimicrobials:   None   Subjective: Hard of hearing but does not have any complaints.  Abdomen appears to be more bloated and distended.  Review of Systems Otherwise negative except as per HPI, including: General: Denies fever, chills, night sweats or unintended weight loss. Resp: Denies cough, wheezing, shortness of breath. Cardiac: Denies chest pain, palpitations, orthopnea, paroxysmal nocturnal dyspnea. GI: Denies  vomiting, diarrhea or constipation GU: Denies dysuria, frequency, hesitancy or incontinence MS: Denies muscle aches, joint pain or swelling Neuro: Denies headache, neurologic deficits (focal weakness, numbness, tingling), abnormal gait Psych: Denies anxiety, depression, SI/HI/AVH Skin: Denies new rashes or lesions ID:  Denies sick contacts, exotic exposures, travel  Objective: Vitals:   03/11/19 1549 03/11/19 1657 03/11/19 2056 03/12/19 0632  BP: (!) 156/78 (!) 146/72 (!) 142/78 (!) 148/92  Pulse: 79 80 81 99  Resp: 14 16 (!) 21 20  Temp: 98.9 F (37.2 C) 97.8 F (36.6 C) 97.6 F (36.4 C) 98.3 F (36.8 C)  TempSrc: Oral Oral Oral Oral  SpO2: 95% 97%  95%    Intake/Output Summary (Last 24 hours) at 03/12/2019 [id number] Last data filed at 03/12/2019 [id number] Gross per 24 hour  Intake 2900 ml  Output 0 ml  Net 2900 ml   There were no vitals filed for this visit.  Examination:  General exam: Appears calm and comfortable, elderly frail-appearing.  Very hard of hearing. Respiratory system: Clear to auscultation. Respiratory effort normal. Cardiovascular system: S1 & S2 heard, RRR. No JVD, murmurs, rubs, gallops or clicks. No pedal edema. Gastrointestinal system: Diminished bowel sounds. Central nervous system: Alert and oriented. No focal neurological deficits. Extremities: Symmetric 4 x 5 power. Skin: No rashes, lesions or ulcers Psychiatry: Judgement and insight appear normal. Mood & affect appropriate.     Data Reviewed:   CBC: Recent Labs  Lab 03/10/19 2258 03/11/19 0801 03/12/19 0309  WBC 14.4* 15.9* 8.3  NEUTROABS 12.9*  --  6.1  HGB 13.7 15.5* 15.4*  HCT 40.7 46.0 45.2  MCV 85.3 85.3 83.2  PLT 153 192 [id number]   Basic Metabolic Panel: Recent Labs  Lab 03/10/19 2258 03/11/19 0801 03/12/19 0309  NA 135 135 139  K 4.0 3.9 4.0  CL 100 101 102  CO2 27 24 26   GLUCOSE 173* 166* 162*  BUN 16 15 19   CREATININE 0.80 0.72 0.78  CALCIUM 9.4 9.4 9.1  MG  --   --  2.3  PHOS  --   --  3.7   GFR: CrCl cannot be calculated (Unknown ideal weight.). Liver Function Tests: Recent Labs  Lab 03/10/19 2258 03/12/19 0309  AST 21 15  ALT 12 10  ALKPHOS 71 60  BILITOT 0.7 1.0  PROT 7.7 6.8  ALBUMIN 4.1 3.5   Recent Labs  Lab 03/10/19 2258  LIPASE 27   No results for input(s):  AMMONIA in the last 168 hours. Coagulation Profile: No results for input(s): INR, PROTIME in the last 168 hours. Cardiac Enzymes: No results for input(s): CKTOTAL, CKMB, CKMBINDEX, TROPONINI in the last 168 hours. BNP (last 3 results) No results for input(s): PROBNP in the last 8760 hours. HbA1C: No results for input(s): HGBA1C in the last 72 hours. CBG: No results for input(s): GLUCAP in the last 168 hours. Lipid Profile: No results for input(s): CHOL, HDL, LDLCALC, TRIG, CHOLHDL, LDLDIRECT in the last 72 hours. Thyroid Function Tests: No results for input(s): TSH, T4TOTAL, FREET4, T3FREE, THYROIDAB in the last 72 hours. Anemia Panel: No results for input(s): VITAMINB12, FOLATE, FERRITIN, TIBC, IRON, RETICCTPCT in the last 72 hours. Sepsis Labs: No results for input(s): PROCALCITON, LATICACIDVEN in the last 168 hours.  Recent Results (from the past 240 hour(s))  SARS CORONAVIRUS 2 (TAT 6-24 HRS) Nasopharyngeal Nasopharyngeal Swab     Status: None   Collection Time: 03/11/19  1:41 AM   Specimen: Nasopharyngeal Swab  Result Value Ref Range Status   SARS Coronavirus 2 NEGATIVE NEGATIVE Final    Comment: (NOTE) SARS-CoV-2 target nucleic acids are NOT DETECTED. The SARS-CoV-2 RNA is generally detectable in upper and lower respiratory specimens during the acute phase of infection. Negative results do not preclude SARS-CoV-2 infection, do not rule out co-infections with other pathogens, and should not be used as the sole basis for treatment or other patient management decisions. Negative results must be combined with clinical observations, patient history, and epidemiological information. The expected result is Negative. Fact Sheet for Patients: SugarRoll.be Fact Sheet for Healthcare Providers: https://www.woods-mathews.com/ This test is not yet approved or cleared by the [place] FDA and  has been authorized for detection and/or  diagnosis of SARS-CoV-2 by FDA under an Emergency Use Authorization (EUA). This EUA will remain  in effect (meaning this test can be used) for the duration of the COVID-19 declaration under Section 56 4(b)(1) of the Act, 21 U.S.C. section 360bbb-3(b)(1), unless the authorization is terminated or revoked sooner. Performed at [facility], [place] [address], [place], [place] [address]          Radiology Studies: Ct Abdomen Pelvis Wo Contrast  Result Date: 03/11/2019 CLINICAL DATA:  Nausea and vomiting EXAM: CT ABDOMEN AND PELVIS WITHOUT CONTRAST TECHNIQUE: Multidetector CT imaging of the abdomen and pelvis was performed following the standard protocol without IV contrast. COMPARISON:  None. FINDINGS: Lower chest: There is a trace right-sided pleural effusion.There are advanced mitral valve calcifications and coronary artery calcifications. Hepatobiliary: The liver is normal. Normal gallbladder.There is no biliary ductal dilation. Pancreas: There is a 1.5 x 1.5 cm cystic appearing structure that appears to be arising from the pancreatic tail. Spleen: No splenic laceration or hematoma. Adrenals/Urinary Tract: --Adrenal glands: No adrenal hemorrhage. --Right kidney/ureter: No hydronephrosis or perinephric hematoma. --Left kidney/ureter: There is a nonobstructing stone in the interpolar region of the left kidney. --Urinary bladder: The urinary bladder is distended. Stomach/Bowel: --Stomach/Duodenum: No hiatal hernia or other gastric abnormality. Normal duodenal course and  caliber. --Small bowel: There are dilated loops of small bowel measuring up to approximately 4 cm with air-fluid levels. There appears to be a transition point in the low midline abdomen (axial series 3, images 48 through 52). --Colon: Rectosigmoid diverticulosis without acute inflammation. --Appendix: Normal. Vascular/Lymphatic: Atherosclerotic calcification is present within the non-aneurysmal abdominal aorta, without  hemodynamically significant stenosis. --No retroperitoneal lymphadenopathy. --No mesenteric lymphadenopathy. --No pelvic or inguinal lymphadenopathy. Reproductive: Unremarkable Other: No ascites or free air. The abdominal wall is normal. Musculoskeletal. No acute displaced fractures. IMPRESSION: 1. Small bowel obstruction with transition point in the low midline abdomen. 2. Trace right-sided pleural effusion. 3. A 1.5 cm cystic mass that appears to be arising from the pancreatic tail. 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 Aortic Atherosclerosis (ICD10-I70.0). Electronically Signed   By: Constance Holster M.D.   On: 03/11/2019 00:09   Dg Abd 1 View  Result Date: 03/12/2019 CLINICAL DATA:  Small-bowel obstruction, abdominal distension EXAM: ABDOMEN - 1 VIEW COMPARISON:  03/11/2019 FINDINGS: Retained contrast at gastric fundus. Scattered gas and contrast throughout persistently dilated small bowel loops consistent with high-grade small bowel obstruction. No significant colonic contrast. No definite bowel wall thickening. Bones demineralized. IMPRESSION: Persistent high-grade small bowel obstruction. Electronically Signed   By: Lavonia Dana M.D.   On: 03/12/2019 07:25   Dg Abd Portable 1v-small Bowel Obstruction Protocol-initial, 8 Hr Delay  Result Date: 03/11/2019 CLINICAL DATA:  Follow-up small-bowel protocol EXAM: PORTABLE ABDOMEN - 1 VIEW COMPARISON:  03/10/2019 FINDINGS: Contrast material is noted within the stomach. No significant passage of contrast is noted distally. Multiple dilated small bowel loops are noted. No free air is seen. Continued follow-up is recommended. IMPRESSION: Stable appearance of small bowel dilatation. No significant passage of contrast material is noted distally. Electronically Signed   By: Inez Catalina M.D.   On: 03/11/2019 21:25        Scheduled Meds: . amLODipine  5 mg Oral Daily  . aspirin  81 mg Oral Daily  . bisacodyl  10 mg Rectal Daily   . enoxaparin (LOVENOX) injection  40 mg Subcutaneous Daily  . latanoprost  1 drop Both Eyes QHS  . lip balm  1 application Topical BID   Continuous Infusions: . sodium chloride Stopped (03/11/19 1207)  . lactated ringers    . ondansetron (ZOFRAN) IV       LOS: 1 day   Time spent= 35 mins    Solon Alban Arsenio Loader, MD [facility]  If 7PM-7AM, please contact night-coverage  03/12/2019, 8:33 AM

## 2019-03-12 NOTE — Progress Notes (Signed)
[facility] Progress Note     Subjective: CC-  Complaining of headache this morning. Also states that she feels a little distended and nauseated, no recent emesis. No bowel movements. Doesn't think she's passed any flatus.  Xray shows persistent SBO   Objective: Vital signs in last 24 hours: Temp:  [97.6 F (36.4 C)-98.9 F (37.2 C)] 98.3 F (36.8 C) (10/28 [id number]) Pulse Rate:  [77-99] 99 (10/28 0632) Resp:  [14-21] 20 (10/28 [id number]) BP: (142-156)/(72-92) 148/92 (10/28 0632) SpO2:  [95 %-98 %] 95 % (10/28 [id number]) Last BM Date: 03/10/19  Intake/Output from previous day: 10/27 0701 - 10/28 0700 In: 2900 [I.V.:1900; IV Piggyback:1000] Out: 0  Intake/Output this shift: No intake/output data recorded.  PE: Gen:  Alert, NAD, pleasant HEENT: EOM's intact, pupils equal and round Pulm:  Rate and effort normal Abd: Soft, distended, hypoactive BS, no HSM, nontender Skin: no rashes noted, warm and dry  Lab Results:  Recent Labs    03/11/19 0801 03/12/19 0309  WBC 15.9* 8.3  HGB 15.5* 15.4*  HCT 46.0 45.2  PLT 192 199   BMET Recent Labs    03/11/19 0801 03/12/19 0309  NA 135 139  K 3.9 4.0  CL 101 102  CO2 24 26  GLUCOSE 166* 162*  BUN 15 19  CREATININE 0.72 0.78  CALCIUM 9.4 9.1   PT/INR No results for input(s): LABPROT, INR in the last 72 hours. CMP     Component Value Date/Time   NA 139 03/12/2019 0309   K 4.0 03/12/2019 0309   CL 102 03/12/2019 0309   CO2 26 03/12/2019 0309   GLUCOSE 162 (H) 03/12/2019 0309   BUN 19 03/12/2019 0309   CREATININE 0.78 03/12/2019 0309   CALCIUM 9.1 03/12/2019 0309   PROT 6.8 03/12/2019 0309   ALBUMIN 3.5 03/12/2019 0309   AST 15 03/12/2019 0309   ALT 10 03/12/2019 0309   ALKPHOS 60 03/12/2019 0309   BILITOT 1.0 03/12/2019 0309   GFRNONAA >60 03/12/2019 0309   GFRAA >60 03/12/2019 0309   Lipase     Component Value Date/Time   LIPASE 27 03/10/2019 2258       Studies/Results: Ct Abdomen Pelvis Wo  Contrast  Result Date: 03/11/2019 CLINICAL DATA:  Nausea and vomiting EXAM: CT ABDOMEN AND PELVIS WITHOUT CONTRAST TECHNIQUE: Multidetector CT imaging of the abdomen and pelvis was performed following the standard protocol without IV contrast. COMPARISON:  None. FINDINGS: Lower chest: There is a trace right-sided pleural effusion.There are advanced mitral valve calcifications and coronary artery calcifications. Hepatobiliary: The liver is normal. Normal gallbladder.There is no biliary ductal dilation. Pancreas: There is a 1.5 x 1.5 cm cystic appearing structure that appears to be arising from the pancreatic tail. Spleen: No splenic laceration or hematoma. Adrenals/Urinary Tract: --Adrenal glands: No adrenal hemorrhage. --Right kidney/ureter: No hydronephrosis or perinephric hematoma. --Left kidney/ureter: There is a nonobstructing stone in the interpolar region of the left kidney. --Urinary bladder: The urinary bladder is distended. Stomach/Bowel: --Stomach/Duodenum: No hiatal hernia or other gastric abnormality. Normal duodenal course and caliber. --Small bowel: There are dilated loops of small bowel measuring up to approximately 4 cm with air-fluid levels. There appears to be a transition point in the low midline abdomen (axial series 3, images 48 through 52). --Colon: Rectosigmoid diverticulosis without acute inflammation. --Appendix: Normal. Vascular/Lymphatic: Atherosclerotic calcification is present within the non-aneurysmal abdominal aorta, without hemodynamically significant stenosis. --No retroperitoneal lymphadenopathy. --No mesenteric lymphadenopathy. --No pelvic or inguinal lymphadenopathy. Reproductive: Unremarkable Other: No ascites  or free air. The abdominal wall is normal. Musculoskeletal. No acute displaced fractures. IMPRESSION: 1. Small bowel obstruction with transition point in the low midline abdomen. 2. Trace right-sided pleural effusion. 3. A 1.5 cm cystic mass that appears to be arising  from the pancreatic tail. 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 Aortic Atherosclerosis (ICD10-I70.0). Electronically Signed   By: Constance Holster M.D.   On: 03/11/2019 00:09   Dg Abd 1 View  Result Date: 03/12/2019 CLINICAL DATA:  Small-bowel obstruction, abdominal distension EXAM: ABDOMEN - 1 VIEW COMPARISON:  03/11/2019 FINDINGS: Retained contrast at gastric fundus. Scattered gas and contrast throughout persistently dilated small bowel loops consistent with high-grade small bowel obstruction. No significant colonic contrast. No definite bowel wall thickening. Bones demineralized. IMPRESSION: Persistent high-grade small bowel obstruction. Electronically Signed   By: Lavonia Dana M.D.   On: 03/12/2019 07:25   Dg Abd Portable 1v-small Bowel Obstruction Protocol-initial, 8 Hr Delay  Result Date: 03/11/2019 CLINICAL DATA:  Follow-up small-bowel protocol EXAM: PORTABLE ABDOMEN - 1 VIEW COMPARISON:  03/10/2019 FINDINGS: Contrast material is noted within the stomach. No significant passage of contrast is noted distally. Multiple dilated small bowel loops are noted. No free air is seen. Continued follow-up is recommended. IMPRESSION: Stable appearance of small bowel dilatation. No significant passage of contrast material is noted distally. Electronically Signed   By: Inez Catalina M.D.   On: 03/11/2019 21:25    Anti-infectives: Anti-infectives (From admission, onward)   None       Assessment/Plan HTN Dementia Pancreatic tail cystic mass, ?pancreatic serous cystadenoma  SBO - Patient with no return in bowel function and persistent findings of SBO on xray. Recommend NG tube for decompression. Mobilize as able. May repeat small bowel protocol once decompressed.  ID - none VTE - SCDs, lovenox FEN - IVF, NPO/NGT Foley - none Follow up - TBD   LOS: 1 day    Wellington Hampshire, [facility] 03/12/2019, 9:39 AM Please see Amion for pager number during  day hours 7:00am-4:30pm

## 2019-03-13 ENCOUNTER — Other Ambulatory Visit: Payer: Self-pay

## 2019-03-13 ENCOUNTER — Encounter (HOSPITAL_COMMUNITY): Payer: Self-pay | Admitting: Surgery

## 2019-03-13 DIAGNOSIS — I10 Essential (primary) hypertension: Secondary | ICD-10-CM | POA: Diagnosis present

## 2019-03-13 NOTE — Progress Notes (Signed)
MD paged regarding low NGT output and xray results. MD informed placement was okay.

## 2019-03-13 NOTE — Progress Notes (Addendum)
Patients sister called for an update 10/29 and is requesting that patient be discharged to rehab facility instead of back home with her if at all possible.   Patients nephew Lonia Farber is POA and is requesting that MD call him to discuss discharge information [phone], nephew stated that Mrs. Millers husband lives in [place] and would like to have them reunited at same facility.

## 2019-03-13 NOTE — Progress Notes (Signed)
PROGRESS NOTE    MARLEIGH ANGELICO  [id number] DOB: [birth year] DOA: 03/10/2019 PCP: System, Pcp Not In   Brief Narrative:  85 year old with past medical history of HTN, HLD, GERD, dementia came to the hospital with acute abdominal pain.  CT was consistent with small bowel obstruction with transition point in the low mid abdomen, 1.5 cm cystic mass in the pancreatic tail.  Past surgical history of hysterectomy.  NG tube placed, small bowel protocol.  General surgery following.   Assessment & Plan:   Principal Problem:   SBO (small bowel obstruction) (HCC) Active Problems:   Dementia (HCC)   Essential hypertension   OAB (overactive bladder)   Cystic mass of tail pancreas   Hypertension  Small bowel obstruction, acute-conservative management at this time History of constipation -Conservative management at this time.  NG tube to be clamped.  Clear liquid trial.  Out of bed to chair.  Supportive care. -Dulcolax suppository  Dementia-chronic and stable  Essential hypertension-continue Norvasc  DVT prophylaxis: Lovenox Code Status: Full Family Communication: Spoke with her sister over the phone Disposition Plan: Maintain hospital stay until bowel function is returned to normal and she is tolerating oral diet without any issues.  Unsafe for discharge.  Consultants:   General surgery  Procedures:   None  Antimicrobials:   None  Subjective: Overnight slightly confused trying to pull her NG tube out.  Large bowel movement yesterday.  No complaints this morning, wants NG tube to be taken out.  Review of Systems Otherwise negative except as per HPI, including: General = no fevers, chills, dizziness, malaise, fatigue HEENT/EYES = negative for pain, redness, loss of vision, double vision, blurred vision, loss of hearing, sore throat, hoarseness, dysphagia Cardiovascular= negative for chest pain, palpitation, murmurs, lower extremity swelling Respiratory/lungs= negative for  shortness of breath, cough, hemoptysis, wheezing, mucus production Gastrointestinal= negative for nausea, vomiting,, abdominal pain, melena, hematemesis Genitourinary= negative for Dysuria, Hematuria, Change in Urinary Frequency MSK = Negative for arthralgia, myalgias, Back Pain, Joint swelling  Neurology= Negative for headache, seizures, numbness, tingling  Psychiatry= Negative for anxiety, depression, suicidal and homocidal ideation Allergy/Immunology= Medication/Food allergy as listed  Skin= Negative for Rash, lesions, ulcers, itching  Objective: Vitals:   03/12/19 1338 03/12/19 2310 03/13/19 0300 03/13/19 0628  BP: (!) 167/81 (!) 146/79  (!) 152/71  Pulse: 100 93  86  Resp: 16 16  16   Temp: 98.5 F (36.9 C) 98.3 F (36.8 C)  97.9 F (36.6 C)  TempSrc: Oral Oral  Oral  SpO2: 90% 91%  95%  Weight:   49.8 kg   Height:   5\' 7"  (1.702 m)     Intake/Output Summary (Last 24 hours) at 03/13/2019 1108 Last data filed at 03/13/2019 1000 Gross per 24 hour  Intake 576.21 ml  Output 1320 ml  Net -743.79 ml   Filed Weights   03/13/19 0300  Weight: 49.8 kg    Examination: Constitutional: NG tube in place Eyes: PERRL, lids and conjunctivae normal ENMT: Mucous membranes are moist. Posterior pharynx clear of any exudate or lesions.Normal dentition.  Neck: normal, supple, no masses, no thyromegaly Respiratory: clear to auscultation bilaterally, no wheezing, no crackles. Normal respiratory effort. No accessory muscle use.  Cardiovascular: Regular rate and rhythm, no murmurs / rubs / gallops. No extremity edema. 2+ pedal pulses. No carotid bruits.  Abdomen: Minimal bowel sounds.  NG tube in place Musculoskeletal: No contractures but does have diffuse muscle wasting Skin: no rashes, lesions, ulcers. No induration Neurologic: Alert  awake oriented to name and place, no focal neuro deficits Psychiatric: Poor judgment and insight   Data Reviewed:   CBC: Recent Labs  Lab 03/10/19  2258 03/11/19 0801 03/12/19 0309  WBC 14.4* 15.9* 8.3  NEUTROABS 12.9*  --  6.1  HGB 13.7 15.5* 15.4*  HCT 40.7 46.0 45.2  MCV 85.3 85.3 83.2  PLT 153 192 [id number]   Basic Metabolic Panel: Recent Labs  Lab 03/10/19 2258 03/11/19 0801 03/12/19 0309  NA 135 135 139  K 4.0 3.9 4.0  CL 100 101 102  CO2 27 24 26   GLUCOSE 173* 166* 162*  BUN 16 15 19   CREATININE 0.80 0.72 0.78  CALCIUM 9.4 9.4 9.1  MG  --   --  2.3  PHOS  --   --  3.7   GFR: Estimated Creatinine Clearance: 32.3 mL/min (by C-G formula based on SCr of 0.78 mg/dL). Liver Function Tests: Recent Labs  Lab 03/10/19 2258 03/12/19 0309  AST 21 15  ALT 12 10  ALKPHOS 71 60  BILITOT 0.7 1.0  PROT 7.7 6.8  ALBUMIN 4.1 3.5   Recent Labs  Lab 03/10/19 2258  LIPASE 27   No results for input(s): AMMONIA in the last 168 hours. Coagulation Profile: No results for input(s): INR, PROTIME in the last 168 hours. Cardiac Enzymes: No results for input(s): CKTOTAL, CKMB, CKMBINDEX, TROPONINI in the last 168 hours. BNP (last 3 results) No results for input(s): PROBNP in the last 8760 hours. HbA1C: No results for input(s): HGBA1C in the last 72 hours. CBG: No results for input(s): GLUCAP in the last 168 hours. Lipid Profile: No results for input(s): CHOL, HDL, LDLCALC, TRIG, CHOLHDL, LDLDIRECT in the last 72 hours. Thyroid Function Tests: No results for input(s): TSH, T4TOTAL, FREET4, T3FREE, THYROIDAB in the last 72 hours. Anemia Panel: No results for input(s): VITAMINB12, FOLATE, FERRITIN, TIBC, IRON, RETICCTPCT in the last 72 hours. Sepsis Labs: No results for input(s): PROCALCITON, LATICACIDVEN in the last 168 hours.  Recent Results (from the past 240 hour(s))  SARS CORONAVIRUS 2 (TAT 6-24 HRS) Nasopharyngeal Nasopharyngeal Swab     Status: None   Collection Time: 03/11/19  1:41 AM   Specimen: Nasopharyngeal Swab  Result Value Ref Range Status   SARS Coronavirus 2 NEGATIVE NEGATIVE Final    Comment: (NOTE)  SARS-CoV-2 target nucleic acids are NOT DETECTED. The SARS-CoV-2 RNA is generally detectable in upper and lower respiratory specimens during the acute phase of infection. Negative results do not preclude SARS-CoV-2 infection, do not rule out co-infections with other pathogens, and should not be used as the sole basis for treatment or other patient management decisions. Negative results must be combined with clinical observations, patient history, and epidemiological information. The expected result is Negative. Fact Sheet for Patients: SugarRoll.be Fact Sheet for Healthcare Providers: https://www.woods-mathews.com/ This test is not yet approved or cleared by the [place] FDA and  has been authorized for detection and/or diagnosis of SARS-CoV-2 by FDA under an Emergency Use Authorization (EUA). This EUA will remain  in effect (meaning this test can be used) for the duration of the COVID-19 declaration under Section 56 4(b)(1) of the Act, 21 U.S.C. section 360bbb-3(b)(1), unless the authorization is terminated or revoked sooner. Performed at [facility], [place] [address] [place], [place] [address]          Radiology Studies: Dg Abd 1 View  Result Date: 03/12/2019 CLINICAL DATA:  Small-bowel obstruction, abdominal distension EXAM: ABDOMEN - 1 VIEW COMPARISON:  03/11/2019 FINDINGS: Retained contrast at gastric fundus. Scattered gas and contrast throughout persistently dilated small bowel loops consistent with high-grade small bowel obstruction. No significant colonic contrast. No definite bowel wall thickening. Bones demineralized. IMPRESSION: Persistent high-grade small bowel obstruction. Electronically Signed   By: Lavonia Dana M.D.   On: 03/12/2019 07:25   Dg Abd Portable 1v-small Bowel Obstruction Protocol-initial, 8 Hr Delay  Result Date: 03/12/2019 CLINICAL DATA:  Small bowel protocol 8 hour delay EXAM: PORTABLE ABDOMEN - 1  VIEW COMPARISON:  CT 03/10/2019, radiograph 03/12/2019, 03/11/2019 FINDINGS: Esophageal tube is folded back upon itself in the stomach with the tip position beneath the left hemidiaphragm. Radiopaque contrast material is present within the colon. There remains however significantly dilated central small bowel up to 5.2 cm. IMPRESSION: 1. Radiopaque contrast material visible within the colon 2. Persistent prominent gaseous dilatation of central small bowel consistent with obstruction 3. Esophageal tube is folded back upon itself in the stomach with the tip positioned in the left upper quadrant Electronically Signed   By: Donavan Foil M.D.   On: 03/12/2019 21:58   Dg Abd Portable 1v  Result Date: 03/12/2019 CLINICAL DATA:  Small bowel obstruction. NG tube placement. EXAM: PORTABLE ABDOMEN - 1 VIEW 10:58 a.m. COMPARISON:  Comparison 03/11/2019 at 8:59 p.m. FINDINGS: NG tube is been inserted. The tip is in the fundus of the stomach. Multiple distended small bowel loops are noted in the lower abdomen. IMPRESSION: 1. The tip of the nasogastric tube is in the fundus of the stomach. 2. Multiple distended small bowel loops. Electronically Signed   By: Lorriane Shire M.D.   On: 03/12/2019 11:18   Dg Abd Portable 1v-small Bowel Obstruction Protocol-initial, 8 Hr Delay  Result Date: 03/11/2019 CLINICAL DATA:  Follow-up small-bowel protocol EXAM: PORTABLE ABDOMEN - 1 VIEW COMPARISON:  03/10/2019 FINDINGS: Contrast material is noted within the stomach. No significant passage of contrast is noted distally. Multiple dilated small bowel loops are noted. No free air is seen. Continued follow-up is recommended. IMPRESSION: Stable appearance of small bowel dilatation. No significant passage of contrast material is noted distally. Electronically Signed   By: Inez Catalina M.D.   On: 03/11/2019 21:25        Scheduled Meds: . amLODipine  5 mg Oral Daily  . aspirin  81 mg Oral Daily  . bisacodyl  10 mg Rectal Daily  .  enoxaparin (LOVENOX) injection  40 mg Subcutaneous Daily  . latanoprost  1 drop Both Eyes QHS  . lip balm  1 application Topical BID   Continuous Infusions: . sodium chloride 75 mL/hr at 03/13/19 0307  . methocarbamol (ROBAXIN) IV    . ondansetron (ZOFRAN) IV       LOS: 2 days   Time spent= 35 mins    Muzammil Bruins Arsenio Loader, MD [facility]  If 7PM-7AM, please contact night-coverage  03/13/2019, 11:08 AM

## 2019-03-13 NOTE — Progress Notes (Addendum)
[facility] Progress Note     Subjective: CC-  Transfer to floor.  No major issues.  Wants NG tube out.     Objective: Vital signs in last 24 hours: Temp:  [97.9 F (36.6 C)-98.5 F (36.9 C)] 97.9 F (36.6 C) (10/29 0628) Pulse Rate:  [86-100] 86 (10/29 0628) Resp:  [16] 16 (10/29 0628) BP: (146-167)/(71-81) 152/71 (10/29 0628) SpO2:  [90 %-95 %] 95 % (10/29 0628) Weight:  [49.8 kg] 49.8 kg (10/29 0300) Last BM Date: 03/13/19  Intake/Output from previous day: 10/28 0701 - 10/29 0700 In: 198.7 [I.V.:198.7] Out: 1320 [Urine:250; Emesis/NG output:1070] Intake/Output this shift: No intake/output data recorded.  PE: Gen:  Alert, NAD, pleasant HEENT: EOM's intact, pupils equal and round.  NG tube in place.  Scant thin bilious drainage Pulm:  Rate and effort normal Abd: Softer.  Only mildly distended.  No guarding or peritonitis.   Skin: no rashes noted, warm and dry  Lab Results:  Recent Labs    03/11/19 0801 03/12/19 0309  WBC 15.9* 8.3  HGB 15.5* 15.4*  HCT 46.0 45.2  PLT 192 199   BMET Recent Labs    03/11/19 0801 03/12/19 0309  NA 135 139  K 3.9 4.0  CL 101 102  CO2 24 26  GLUCOSE 166* 162*  BUN 15 19  CREATININE 0.72 0.78  CALCIUM 9.4 9.1   PT/INR No results for input(s): LABPROT, INR in the last 72 hours. CMP     Component Value Date/Time   NA 139 03/12/2019 0309   K 4.0 03/12/2019 0309   CL 102 03/12/2019 0309   CO2 26 03/12/2019 0309   GLUCOSE 162 (H) 03/12/2019 0309   BUN 19 03/12/2019 0309   CREATININE 0.78 03/12/2019 0309   CALCIUM 9.1 03/12/2019 0309   PROT 6.8 03/12/2019 0309   ALBUMIN 3.5 03/12/2019 0309   AST 15 03/12/2019 0309   ALT 10 03/12/2019 0309   ALKPHOS 60 03/12/2019 0309   BILITOT 1.0 03/12/2019 0309   GFRNONAA >60 03/12/2019 0309   GFRAA >60 03/12/2019 0309   Lipase     Component Value Date/Time   LIPASE 27 03/10/2019 2258       Studies/Results: Dg Abd 1 View  Result Date: 03/12/2019  CLINICAL DATA:  Small-bowel obstruction, abdominal distension EXAM: ABDOMEN - 1 VIEW COMPARISON:  03/11/2019 FINDINGS: Retained contrast at gastric fundus. Scattered gas and contrast throughout persistently dilated small bowel loops consistent with high-grade small bowel obstruction. No significant colonic contrast. No definite bowel wall thickening. Bones demineralized. IMPRESSION: Persistent high-grade small bowel obstruction. Electronically Signed   By: Lavonia Dana M.D.   On: 03/12/2019 07:25   Dg Abd Portable 1v-small Bowel Obstruction Protocol-initial, 8 Hr Delay  Result Date: 03/12/2019 CLINICAL DATA:  Small bowel protocol 8 hour delay EXAM: PORTABLE ABDOMEN - 1 VIEW COMPARISON:  CT 03/10/2019, radiograph 03/12/2019, 03/11/2019 FINDINGS: Esophageal tube is folded back upon itself in the stomach with the tip position beneath the left hemidiaphragm. Radiopaque contrast material is present within the colon. There remains however significantly dilated central small bowel up to 5.2 cm. IMPRESSION: 1. Radiopaque contrast material visible within the colon 2. Persistent prominent gaseous dilatation of central small bowel consistent with obstruction 3. Esophageal tube is folded back upon itself in the stomach with the tip positioned in the left upper quadrant Electronically Signed   By: Donavan Foil M.D.   On: 03/12/2019 21:58   Dg Abd Portable 1v  Result Date: 03/12/2019 CLINICAL DATA:  Small bowel obstruction. NG tube placement. EXAM: PORTABLE ABDOMEN - 1 VIEW 10:58 a.m. COMPARISON:  Comparison 03/11/2019 at 8:59 p.m. FINDINGS: NG tube is been inserted. The tip is in the fundus of the stomach. Multiple distended small bowel loops are noted in the lower abdomen. IMPRESSION: 1. The tip of the nasogastric tube is in the fundus of the stomach. 2. Multiple distended small bowel loops. Electronically Signed   By: Lorriane Shire M.D.   On: 03/12/2019 11:18   Dg Abd Portable 1v-small Bowel Obstruction  Protocol-initial, 8 Hr Delay  Result Date: 03/11/2019 CLINICAL DATA:  Follow-up small-bowel protocol EXAM: PORTABLE ABDOMEN - 1 VIEW COMPARISON:  03/10/2019 FINDINGS: Contrast material is noted within the stomach. No significant passage of contrast is noted distally. Multiple dilated small bowel loops are noted. No free air is seen. Continued follow-up is recommended. IMPRESSION: Stable appearance of small bowel dilatation. No significant passage of contrast material is noted distally. Electronically Signed   By: Inez Catalina M.D.   On: 03/11/2019 21:25    Anti-infectives: Anti-infectives (From admission, onward)   None       Assessment/Plan HTN Dementia Pancreatic tail cystic mass, ?pancreatic serous cystadenoma  SBO -Improvement with nasogastric tube decompression.  Abdomen softer.  X-ray show contrast in colon.  Nasogastric tube slightly folded but no kinking.  Therefore do not reposition for now  Given her advanced age and dementia, would do a short clamping trial.  Clamp NG now & begin clear/thin liquids.  If no nausea or worsening pain and residuals less than 28mL in 6 hours, remove nasogastric tube in 6 hours later today and advance to dysphagia 1 diet this evening.  If tolerates that, most likely can advance to solid diet tomorrow. If has worsening bloating or high residuals, return to suction and reevaluate tomorrow  ID - none VTE - SCDs, lovenox FEN - IVF, NGT clamping trial.  Most likely advance to dysphagia 1 diet this evening Foley - none Follow up - TBD  NG out and a soft diet in front of her.  I will change to clear liquids.  We will continue to follow.     LOS: 2 days    Adin Hector, MD, FACS, MASCRS Gastrointestinal and Minimally Invasive Surgery  [facility] [address] [place], [place] [address] [phone] Main / Paging [phone] Fax      03/13/2019, 9:12 AM Please see Amion for pager number during  day hours 7:00am-4:30pm

## 2019-03-14 ENCOUNTER — Inpatient Hospital Stay (HOSPITAL_COMMUNITY): Payer: Medicare Other

## 2019-03-14 LAB — BASIC METABOLIC PANEL
Anion gap: 8 (ref 5–15)
BUN: 11 mg/dL (ref 8–23)
CO2: 25 mmol/L (ref 22–32)
Calcium: 8.5 mg/dL — ABNORMAL LOW (ref 8.9–10.3)
Chloride: 106 mmol/L (ref 98–111)
Creatinine, Ser: 0.52 mg/dL (ref 0.44–1.00)
GFR calc Af Amer: >60 mL/min (ref >60)
GFR calc non Af Amer: >60 mL/min (ref >60)
Glucose, Bld: 115 mg/dL — ABNORMAL HIGH (ref 70–99)
Potassium: 3.1 mmol/L — ABNORMAL LOW (ref 3.5–5.1)
Sodium: 139 mmol/L (ref 135–145)

## 2019-03-14 MED ORDER — POTASSIUM CHLORIDE 20 MEQ PO PACK
40.0000 meq | PACK | Freq: Once | ORAL | Status: AC
  Administered 2019-03-14: 40 meq via ORAL
  Filled 2019-03-14: qty 2

## 2019-03-14 MED ORDER — POTASSIUM CHLORIDE IN NACL 20-0.9 MEQ/L-% IV SOLN
INTRAVENOUS | Status: DC
  Administered 2019-03-14 – 2019-03-20 (×8): via INTRAVENOUS
  Filled 2019-03-14 (×10): qty 1000

## 2019-03-14 NOTE — Progress Notes (Signed)
PROGRESS NOTE    Christine Kramer  [id number] DOB: 09-14-1920 DOA: 03/10/2019 PCP: System, Pcp Not In   Brief Narrative:  85 year old with past medical history of HTN, HLD, GERD, dementia came to the hospital with acute abdominal pain.  CT was consistent with small bowel obstruction with transition point in the low mid abdomen, 1.5 cm cystic mass in the pancreatic tail.  Past surgical history of hysterectomy.  NG tube placed, small bowel protocol.  General surgery following.  03/14/2019: NG tube has been removed.  Dysphagia 1 diet controlled by the surgical team.  Surgical team is directing care.  No significant history from patient.  Assessment & Plan:   Principal Problem:   SBO (small bowel obstruction) (HCC) Active Problems:   Dementia (HCC)   Essential hypertension   OAB (overactive bladder)   Cystic mass of tail pancreas   Hypertension  Small bowel obstruction, acute-conservative management at this time History of constipation -Conservative management at this time.  NG tube to be clamped.  Clear liquid trial.  Out of bed to chair.  Supportive care. -Dulcolax suppository 03/14/2019: NG tube has been removed.  For trial of dysphagia 1 diet.  Further management depend on hospital course.  Dementia-chronic and stable  Essential hypertension-continue Norvasc  Hypokalemia: -Replete.  DVT prophylaxis: Lovenox Code Status: Full Family Communication: Spoke with her sister over the phone Disposition Plan: Maintain hospital stay until bowel function is returned to normal and she is tolerating oral diet without any issues.  Unsafe for discharge.  Consultants:   General surgery  Procedures:   None  Antimicrobials:   None  Subjective: No significant history from patient.  Objective: Vitals:   03/13/19 0628 03/13/19 1336 03/13/19 2148 03/14/19 0558  BP: (!) 152/71 (!) 162/70 137/67 (!) 152/68  Pulse: 86 93 82 87  Resp: 16 18 (!) 21 20  Temp: 97.9 F (36.6 C) 97.8  F (36.6 C) 98.1 F (36.7 C) 98.8 F (37.1 C)  TempSrc: Oral Oral Oral Oral  SpO2: 95% 95% 98% 98%  Weight:      Height:        Intake/Output Summary (Last 24 hours) at 03/14/2019 1157 Last data filed at 03/14/2019 0930 Gross per 24 hour  Intake 2263.18 ml  Output 750 ml  Net 1513.18 ml   Filed Weights   03/13/19 0300  Weight: 49.8 kg    Examination: Eyes: No pallor. ENMT: Dry buccal mucosa. Neck: Supple.  No raised JVD.   Respiratory: clear to auscultation  Cardiovascular: Q000111Q, systolic murmur.. Abdomen: Nontender.  Hyperactive bowel sounds. Neurologic: Awake and alert.  Patient moves all extremities.    Data Reviewed:   CBC: Recent Labs  Lab 03/10/19 2258 03/11/19 0801 03/12/19 0309  WBC 14.4* 15.9* 8.3  NEUTROABS 12.9*  --  6.1  HGB 13.7 15.5* 15.4*  HCT 40.7 46.0 45.2  MCV 85.3 85.3 83.2  PLT 153 192 [id number]   Basic Metabolic Panel: Recent Labs  Lab 03/10/19 2258 03/11/19 0801 03/12/19 0309 03/14/19 1010  NA 135 135 139 139  K 4.0 3.9 4.0 3.1*  CL 100 101 102 106  CO2 27 24 26 25   GLUCOSE 173* 166* 162* 115*  BUN 16 15 19 11   CREATININE 0.80 0.72 0.78 0.52  CALCIUM 9.4 9.4 9.1 8.5*  MG  --   --  2.3  --   PHOS  --   --  3.7  --    GFR: Estimated Creatinine Clearance: 32.3 mL/min (by C-G formula  based on SCr of 0.52 mg/dL). Liver Function Tests: Recent Labs  Lab 03/10/19 2258 03/12/19 0309  AST 21 15  ALT 12 10  ALKPHOS 71 60  BILITOT 0.7 1.0  PROT 7.7 6.8  ALBUMIN 4.1 3.5   Recent Labs  Lab 03/10/19 2258  LIPASE 27   No results for input(s): AMMONIA in the last 168 hours. Coagulation Profile: No results for input(s): INR, PROTIME in the last 168 hours. Cardiac Enzymes: No results for input(s): CKTOTAL, CKMB, CKMBINDEX, TROPONINI in the last 168 hours. BNP (last 3 results) No results for input(s): PROBNP in the last 8760 hours. HbA1C: No results for input(s): HGBA1C in the last 72 hours. CBG: No results for input(s): GLUCAP  in the last 168 hours. Lipid Profile: No results for input(s): CHOL, HDL, LDLCALC, TRIG, CHOLHDL, LDLDIRECT in the last 72 hours. Thyroid Function Tests: No results for input(s): TSH, T4TOTAL, FREET4, T3FREE, THYROIDAB in the last 72 hours. Anemia Panel: No results for input(s): VITAMINB12, FOLATE, FERRITIN, TIBC, IRON, RETICCTPCT in the last 72 hours. Sepsis Labs: No results for input(s): PROCALCITON, LATICACIDVEN in the last 168 hours.  Recent Results (from the past 240 hour(s))  SARS CORONAVIRUS 2 (TAT 6-24 HRS) Nasopharyngeal Nasopharyngeal Swab     Status: None   Collection Time: 03/11/19  1:41 AM   Specimen: Nasopharyngeal Swab  Result Value Ref Range Status   SARS Coronavirus 2 NEGATIVE NEGATIVE Final    Comment: (NOTE) SARS-CoV-2 target nucleic acids are NOT DETECTED. The SARS-CoV-2 RNA is generally detectable in upper and lower respiratory specimens during the acute phase of infection. Negative results do not preclude SARS-CoV-2 infection, do not rule out co-infections with other pathogens, and should not be used as the sole basis for treatment or other patient management decisions. Negative results must be combined with clinical observations, patient history, and epidemiological information. The expected result is Negative. Fact Sheet for Patients: SugarRoll.be Fact Sheet for Healthcare Providers: https://www.woods-mathews.com/ This test is not yet approved or cleared by the [place] FDA and  has been authorized for detection and/or diagnosis of SARS-CoV-2 by FDA under an Emergency Use Authorization (EUA). This EUA will remain  in effect (meaning this test can be used) for the duration of the COVID-19 declaration under Section 56 4(b)(1) of the Act, 21 U.S.C. section 360bbb-3(b)(1), unless the authorization is terminated or revoked sooner. Performed at [facility], [place] [address], [place], [place] [address]           Radiology Studies: Dg Abd Portable 1v-small Bowel Obstruction Protocol-24 Hr Delay  Result Date: 03/14/2019 CLINICAL DATA:  Small-bowel obstruction. EXAM: PORTABLE ABDOMEN - 1 VIEW COMPARISON:  03/12/2019 FINDINGS: Persistent mildly dilated air-filled loops of small bowel. There is also scattered air in the colon. The contrast seen in the colon on the prior study has been evacuated. No free air. IMPRESSION: Persistent mildly dilated loops of small bowel in the central abdomen but contrast passed all the way from the stomach through the colon. This could be partial small bowel obstruction. Electronically Signed   By: Marijo Sanes M.D.   On: 03/14/2019 05:41   Dg Abd Portable 1v-small Bowel Obstruction Protocol-initial, 8 Hr Delay  Result Date: 03/12/2019 CLINICAL DATA:  Small bowel protocol 8 hour delay EXAM: PORTABLE ABDOMEN - 1 VIEW COMPARISON:  CT 03/10/2019, radiograph 03/12/2019, 03/11/2019 FINDINGS: Esophageal tube is folded back upon itself in the stomach with the tip position beneath the left hemidiaphragm. Radiopaque contrast material is present within the colon.  There remains however significantly dilated central small bowel up to 5.2 cm. IMPRESSION: 1. Radiopaque contrast material visible within the colon 2. Persistent prominent gaseous dilatation of central small bowel consistent with obstruction 3. Esophageal tube is folded back upon itself in the stomach with the tip positioned in the left upper quadrant Electronically Signed   By: Donavan Foil M.D.   On: 03/12/2019 21:58        Scheduled Meds: . amLODipine  5 mg Oral Daily  . aspirin  81 mg Oral Daily  . bisacodyl  10 mg Rectal Daily  . enoxaparin (LOVENOX) injection  40 mg Subcutaneous Daily  . latanoprost  1 drop Both Eyes QHS  . lip balm  1 application Topical BID   Continuous Infusions: . sodium chloride 50 mL/hr at 03/14/19 0939  . methocarbamol (ROBAXIN) IV    . ondansetron (ZOFRAN) IV       LOS: 3 days    Time spent= 25 mins    Bonnell Public, MD [facility]  If 7PM-7AM, please contact night-coverage  03/14/2019, 11:57 AM

## 2019-03-14 NOTE — Progress Notes (Signed)
[facility] Progress Note     Subjective: CC-  Tolerated clamping trial with NG tube removed.  On clears without any emesis.  Sitting up in chair.  Denies any abdominal pain.     Objective: Vital signs in last 24 hours: Temp:  [97.8 F (36.6 C)-98.8 F (37.1 C)] 98.8 F (37.1 C) (10/30 0558) Pulse Rate:  [82-93] 87 (10/30 0558) Resp:  [18-21] 20 (10/30 0558) BP: (137-162)/(67-70) 152/68 (10/30 0558) SpO2:  [95 %-98 %] 98 % (10/30 0558) Last BM Date: 03/07/19  Intake/Output from previous day: 10/29 0701 - 10/30 0700 In: 2640.7 [P.O.:1150; I.V.:1490.7] Out: 750 [Urine:750] Intake/Output this shift: No intake/output data recorded.  PE: Gen:  Alert, NAD, pleasant.  Mildly confused consistent with her baseline dementia HEENT: EOM's intact, pupils equal and round.  Hard of hearing Pulm:  Rate and effort normal Abd: Soft.  Only mildly distended.  No abdominal pain/discomfort.  No guarding or peritonitis.   Skin: no rashes noted, warm and dry  Lab Results:  Recent Labs    03/12/19 0309  WBC 8.3  HGB 15.4*  HCT 45.2  PLT 199   BMET Recent Labs    03/12/19 0309  NA 139  K 4.0  CL 102  CO2 26  GLUCOSE 162*  BUN 19  CREATININE 0.78  CALCIUM 9.1   PT/INR No results for input(s): LABPROT, INR in the last 72 hours. CMP     Component Value Date/Time   NA 139 03/12/2019 0309   K 4.0 03/12/2019 0309   CL 102 03/12/2019 0309   CO2 26 03/12/2019 0309   GLUCOSE 162 (H) 03/12/2019 0309   BUN 19 03/12/2019 0309   CREATININE 0.78 03/12/2019 0309   CALCIUM 9.1 03/12/2019 0309   PROT 6.8 03/12/2019 0309   ALBUMIN 3.5 03/12/2019 0309   AST 15 03/12/2019 0309   ALT 10 03/12/2019 0309   ALKPHOS 60 03/12/2019 0309   BILITOT 1.0 03/12/2019 0309   GFRNONAA >60 03/12/2019 0309   GFRAA >60 03/12/2019 0309   Lipase     Component Value Date/Time   LIPASE 27 03/10/2019 2258       Studies/Results: Dg Abd Portable 1v-small Bowel Obstruction  Protocol-24 Hr Delay  Result Date: 03/14/2019 CLINICAL DATA:  Small-bowel obstruction. EXAM: PORTABLE ABDOMEN - 1 VIEW COMPARISON:  03/12/2019 FINDINGS: Persistent mildly dilated air-filled loops of small bowel. There is also scattered air in the colon. The contrast seen in the colon on the prior study has been evacuated. No free air. IMPRESSION: Persistent mildly dilated loops of small bowel in the central abdomen but contrast passed all the way from the stomach through the colon. This could be partial small bowel obstruction. Electronically Signed   By: Marijo Sanes M.D.   On: 03/14/2019 05:41   Dg Abd Portable 1v-small Bowel Obstruction Protocol-initial, 8 Hr Delay  Result Date: 03/12/2019 CLINICAL DATA:  Small bowel protocol 8 hour delay EXAM: PORTABLE ABDOMEN - 1 VIEW COMPARISON:  CT 03/10/2019, radiograph 03/12/2019, 03/11/2019 FINDINGS: Esophageal tube is folded back upon itself in the stomach with the tip position beneath the left hemidiaphragm. Radiopaque contrast material is present within the colon. There remains however significantly dilated central small bowel up to 5.2 cm. IMPRESSION: 1. Radiopaque contrast material visible within the colon 2. Persistent prominent gaseous dilatation of central small bowel consistent with obstruction 3. Esophageal tube is folded back upon itself in the stomach with the tip positioned in the left upper quadrant Electronically Signed   By:  Donavan Foil M.D.   On: 03/12/2019 21:58   Dg Abd Portable 1v  Result Date: 03/12/2019 CLINICAL DATA:  Small bowel obstruction. NG tube placement. EXAM: PORTABLE ABDOMEN - 1 VIEW 10:58 a.m. COMPARISON:  Comparison 03/11/2019 at 8:59 p.m. FINDINGS: NG tube is been inserted. The tip is in the fundus of the stomach. Multiple distended small bowel loops are noted in the lower abdomen. IMPRESSION: 1. The tip of the nasogastric tube is in the fundus of the stomach. 2. Multiple distended small bowel loops. Electronically Signed    By: Lorriane Shire M.D.   On: 03/12/2019 11:18    Anti-infectives: Anti-infectives (From admission, onward)   None       Assessment/Plan HTN Dementia Pancreatic tail cystic mass, ?pancreatic serous cystadenoma  SBO -Improvement with nasogastric tube decompression. -Advance to dysphagia 1 diet.  If tolerates can consider switching to a soft/mechanically softened diet and possible discharge in a day or 2.  Defer disposition to primary service  -No hard indications for surgery at this time.   ID - none VTE - SCDs, lovenox FEN - IVF,  Foley - none Pancreas Tail Mass -small mass in pancreas most likely a benign serous cystadenoma.  Not a surgical candidate.  Will defer to primary care physician but most likely would do follow-up scan at the most or not even that given her advanced age and poor candidacy for surgery with low malignancy suspicion  Follow up - TBD     LOS: 3 days    Adin Hector, MD, FACS, MASCRS Gastrointestinal and Minimally Invasive Surgery  [facility] [address] [place], [place] [address] [phone] Main / Paging [phone] Fax      03/14/2019, 9:33 AM Please see Amion for pager number during day hours 7:00am-4:30pm

## 2019-03-14 NOTE — Care Management Important Message (Signed)
Important Message  Patient Details IM Letter given to Dessa Phi RN to present to the patient Name: Christine Kramer MRN: [id number] Date of Birth: [birth year]   Medicare Important Message Given:  Yes     [name] 03/14/2019, 10:30 AM

## 2019-03-15 LAB — RENAL FUNCTION PANEL
Albumin: 2.9 g/dL — ABNORMAL LOW (ref 3.5–5.0)
Anion gap: 10 (ref 5–15)
BUN: 10 mg/dL (ref 8–23)
CO2: 21 mmol/L — ABNORMAL LOW (ref 22–32)
Calcium: 8.8 mg/dL — ABNORMAL LOW (ref 8.9–10.3)
Chloride: 105 mmol/L (ref 98–111)
Creatinine, Ser: 0.44 mg/dL (ref 0.44–1.00)
GFR calc Af Amer: >60 mL/min (ref >60)
GFR calc non Af Amer: >60 mL/min (ref >60)
Glucose, Bld: 98 mg/dL (ref 70–99)
Phosphorus: 1.8 mg/dL — ABNORMAL LOW (ref 2.5–4.6)
Potassium: 3.7 mmol/L (ref 3.5–5.1)
Sodium: 136 mmol/L (ref 135–145)

## 2019-03-15 LAB — MAGNESIUM: Magnesium: 1.9 mg/dL (ref 1.7–2.4)

## 2019-03-15 MED ORDER — POTASSIUM CHLORIDE 20 MEQ PO PACK
20.0000 meq | PACK | Freq: Once | ORAL | Status: AC
  Administered 2019-03-15: 20 meq via ORAL
  Filled 2019-03-15: qty 1

## 2019-03-15 NOTE — Progress Notes (Signed)
[facility] Progress Note     Subjective: CC-  Tolerated clears  Denies any abdominal pain.  Had several BM's yesterday     Objective: Vital signs in last 24 hours: Temp:  [97.6 F (36.4 C)-98.8 F (37.1 C)] 98.8 F (37.1 C) (10/31 0531) Pulse Rate:  [75-80] 76 (10/31 0531) Resp:  [14-22] 22 (10/31 0531) BP: (140-168)/(73-84) 168/76 (10/31 0531) SpO2:  [98 %-99 %] 98 % (10/31 0531) Last BM Date: 03/14/19  Intake/Output from previous day: 10/30 0701 - 10/31 0700 In: 2130.6 [P.O.:480; I.V.:1650.6] Out: 1600 [Urine:1000; Stool:600] Intake/Output this shift: No intake/output data recorded.  PE: Gen:  Alert, NAD, pleasant.  Mildly confused consistent with her baseline dementia HEENT: EOM's intact, pupils equal and round.  Hard of hearing Pulm:  Rate and effort normal Abd: Soft.  Non distended.  No abdominal pain/discomfort.  No guarding or peritonitis.     Lab Results:  No results for input(s): WBC, HGB, HCT, PLT in the last 72 hours. BMET Recent Labs    03/14/19 1010 03/15/19 0329  NA 139 136  K 3.1* 3.7  CL 106 105  CO2 25 21*  GLUCOSE 115* 98  BUN 11 10  CREATININE 0.52 0.44  CALCIUM 8.5* 8.8*   PT/INR No results for input(s): LABPROT, INR in the last 72 hours. CMP     Component Value Date/Time   NA 136 03/15/2019 0329   K 3.7 03/15/2019 0329   CL 105 03/15/2019 0329   CO2 21 (L) 03/15/2019 0329   GLUCOSE 98 03/15/2019 0329   BUN 10 03/15/2019 0329   CREATININE 0.44 03/15/2019 0329   CALCIUM 8.8 (L) 03/15/2019 0329   PROT 6.8 03/12/2019 0309   ALBUMIN 2.9 (L) 03/15/2019 0329   AST 15 03/12/2019 0309   ALT 10 03/12/2019 0309   ALKPHOS 60 03/12/2019 0309   BILITOT 1.0 03/12/2019 0309   GFRNONAA >60 03/15/2019 0329   GFRAA >60 03/15/2019 0329   Lipase     Component Value Date/Time   LIPASE 27 03/10/2019 2258       Studies/Results: Dg Abd Portable 1v-small Bowel Obstruction Protocol-24 Hr Delay  Result Date:  03/14/2019 CLINICAL DATA:  Small-bowel obstruction. EXAM: PORTABLE ABDOMEN - 1 VIEW COMPARISON:  03/12/2019 FINDINGS: Persistent mildly dilated air-filled loops of small bowel. There is also scattered air in the colon. The contrast seen in the colon on the prior study has been evacuated. No free air. IMPRESSION: Persistent mildly dilated loops of small bowel in the central abdomen but contrast passed all the way from the stomach through the colon. This could be partial small bowel obstruction. Electronically Signed   By: Marijo Sanes M.D.   On: 03/14/2019 05:41    Anti-infectives: Anti-infectives (From admission, onward)   None       Assessment/Plan HTN Dementia Pancreatic tail cystic mass, ?pancreatic serous cystadenoma  SBO -Improvement with nasogastric tube decompression. -Advance to reg diet  -No hard indications for surgery at this time. -ok for d/c if tolerating solid foods   ID - none VTE - SCDs, lovenox FEN - IVF,  Foley - none Pancreas Tail Mass -small mass in pancreas most likely a benign serous cystadenoma.  Not a surgical candidate.  Will defer to primary care physician but most likely would do follow-up scan at the most or not even that given her advanced age and poor candidacy for surgery with low malignancy suspicion  Follow up - TBD     LOS: 4 days  Rosario Adie, MD  Colorectal and General Surgery [facility]   03/15/2019, 8:57 AM Please see Amion for pager number during day hours 7:00am-4:30pm

## 2019-03-15 NOTE — Progress Notes (Signed)
PROGRESS NOTE    Christine Kramer  [id number] DOB: [birth year] DOA: 03/10/2019 PCP: System, Pcp Not In   Brief Narrative:  85 year old with past medical history of HTN, HLD, GERD, dementia came to the hospital with acute abdominal pain.  CT was consistent with small bowel obstruction with transition point in the low mid abdomen, 1.5 cm cystic mass in the pancreatic tail.  Past surgical history of hysterectomy.  NG tube placed, small bowel protocol.  General surgery following.  03/14/2019: NG tube has been removed.  Dysphagia 1 diet controlled by the surgical team.  Surgical team is directing care.  No significant history from patient.  03/15/2019: Patient seen.  Patient has continued to improve.  Diet has been advanced.  Several bowel movements reported overnight.  Surgical team input is appreciated.  Physical therapy input is appreciated, skilled nursing facility recommended.  We will consult social work team.  Assessment & Plan:   Principal Problem:   SBO (small bowel obstruction) (HCC) Active Problems:   Dementia ([place])   Essential hypertension   OAB (overactive bladder)   Cystic mass of tail pancreas   Hypertension  Small bowel obstruction, acute-conservative management at this time History of constipation -Conservative management at this time.  NG tube to be clamped.  Clear liquid trial.  Out of bed to chair.  Supportive care. -Dulcolax suppository 03/14/2019: NG tube has been removed.  For trial of dysphagia 1 diet.  Further management depend on hospital course. 03/15/2019: Patient has improved significantly.  Pursue disposition.  Skilled nursing facility recommended.  Dementia-chronic and stable  Essential hypertension-continue Norvasc  Hypokalemia: -Resolved.  Potassium is 3.7 today.  DVT prophylaxis: Lovenox Code Status: Full Family Communication: Spoke with her sister over the phone Disposition Plan: Maintain hospital stay until bowel function is returned to normal  and she is tolerating oral diet without any issues.  Unsafe for discharge.  Consultants:   General surgery  Procedures:   None  Antimicrobials:   None  Subjective: No significant history from patient.  Objective: Vitals:   03/14/19 1319 03/14/19 2124 03/15/19 0531 03/15/19 1528  BP: (!) 161/84 140/73 (!) 168/76 129/73  Pulse: 80 75 76 85  Resp: 14 18 (!) 22 16  Temp: 97.6 F (36.4 C) 98.3 F (36.8 C) 98.8 F (37.1 C) 98.2 F (36.8 C)  TempSrc: Oral Oral Oral Oral  SpO2: 99% 99% 98% 100%  Weight:      Height:        Intake/Output Summary (Last 24 hours) at 03/15/2019 1720 Last data filed at 03/15/2019 1512 Gross per 24 hour  Intake 1271.67 ml  Output 1006 ml  Net 265.67 ml   Filed Weights   03/13/19 0300  Weight: 49.8 kg    Examination: Eyes: No pallor. ENMT: Dry buccal mucosa. Neck: Supple.  No raised JVD.   Respiratory: clear to auscultation  Cardiovascular: Q000111Q, systolic murmur.. Abdomen: Nontender.  Hyperactive bowel sounds. Neurologic: Awake and alert.  Patient moves all extremities.    Data Reviewed:   CBC: Recent Labs  Lab 03/10/19 2258 03/11/19 0801 03/12/19 0309  WBC 14.4* 15.9* 8.3  NEUTROABS 12.9*  --  6.1  HGB 13.7 15.5* 15.4*  HCT 40.7 46.0 45.2  MCV 85.3 85.3 83.2  PLT 153 192 [id number]   Basic Metabolic Panel: Recent Labs  Lab 03/10/19 2258 03/11/19 0801 03/12/19 0309 03/14/19 1010 03/15/19 0329  NA 135 135 139 139 136  K 4.0 3.9 4.0 3.1* 3.7  CL 100 101 102 106  105  CO2 27 24 26 25  21*  GLUCOSE 173* 166* 162* 115* 98  BUN 16 15 19 11 10   CREATININE 0.80 0.72 0.78 0.52 0.44  CALCIUM 9.4 9.4 9.1 8.5* 8.8*  MG  --   --  2.3  --  1.9  PHOS  --   --  3.7  --  1.8*   GFR: Estimated Creatinine Clearance: 32.3 mL/min (by C-G formula based on SCr of 0.44 mg/dL). Liver Function Tests: Recent Labs  Lab 03/10/19 2258 03/12/19 0309 03/15/19 0329  AST 21 15  --   ALT 12 10  --   ALKPHOS 71 60  --   BILITOT 0.7 1.0  --    PROT 7.7 6.8  --   ALBUMIN 4.1 3.5 2.9*   Recent Labs  Lab 03/10/19 2258  LIPASE 27   No results for input(s): AMMONIA in the last 168 hours. Coagulation Profile: No results for input(s): INR, PROTIME in the last 168 hours. Cardiac Enzymes: No results for input(s): CKTOTAL, CKMB, CKMBINDEX, TROPONINI in the last 168 hours. BNP (last 3 results) No results for input(s): PROBNP in the last 8760 hours. HbA1C: No results for input(s): HGBA1C in the last 72 hours. CBG: No results for input(s): GLUCAP in the last 168 hours. Lipid Profile: No results for input(s): CHOL, HDL, LDLCALC, TRIG, CHOLHDL, LDLDIRECT in the last 72 hours. Thyroid Function Tests: No results for input(s): TSH, T4TOTAL, FREET4, T3FREE, THYROIDAB in the last 72 hours. Anemia Panel: No results for input(s): VITAMINB12, FOLATE, FERRITIN, TIBC, IRON, RETICCTPCT in the last 72 hours. Sepsis Labs: No results for input(s): PROCALCITON, LATICACIDVEN in the last 168 hours.  Recent Results (from the past 240 hour(s))  SARS CORONAVIRUS 2 (TAT 6-24 HRS) Nasopharyngeal Nasopharyngeal Swab     Status: None   Collection Time: 03/11/19  1:41 AM   Specimen: Nasopharyngeal Swab  Result Value Ref Range Status   SARS Coronavirus 2 NEGATIVE NEGATIVE Final    Comment: (NOTE) SARS-CoV-2 target nucleic acids are NOT DETECTED. The SARS-CoV-2 RNA is generally detectable in upper and lower respiratory specimens during the acute phase of infection. Negative results do not preclude SARS-CoV-2 infection, do not rule out co-infections with other pathogens, and should not be used as the sole basis for treatment or other patient management decisions. Negative results must be combined with clinical observations, patient history, and epidemiological information. The expected result is Negative. Fact Sheet for Patients: SugarRoll.be Fact Sheet for Healthcare Providers: https://www.woods-mathews.com/  This test is not yet approved or cleared by the [place] FDA and  has been authorized for detection and/or diagnosis of SARS-CoV-2 by FDA under an Emergency Use Authorization (EUA). This EUA will remain  in effect (meaning this test can be used) for the duration of the COVID-19 declaration under Section 56 4(b)(1) of the Act, 21 U.S.C. section 360bbb-3(b)(1), unless the authorization is terminated or revoked sooner. Performed at [facility], [place] [address], [place], [place] [address]          Radiology Studies: Dg Abd Portable 1v-small Bowel Obstruction Protocol-24 Hr Delay  Result Date: 03/14/2019 CLINICAL DATA:  Small-bowel obstruction. EXAM: PORTABLE ABDOMEN - 1 VIEW COMPARISON:  03/12/2019 FINDINGS: Persistent mildly dilated air-filled loops of small bowel. There is also scattered air in the colon. The contrast seen in the colon on the prior study has been evacuated. No free air. IMPRESSION: Persistent mildly dilated loops of small bowel in the central abdomen but contrast passed all the way from  the stomach through the colon. This could be partial small bowel obstruction. Electronically Signed   By: Marijo Sanes M.D.   On: 03/14/2019 05:41        Scheduled Meds: . amLODipine  5 mg Oral Daily  . aspirin  81 mg Oral Daily  . bisacodyl  10 mg Rectal Daily  . enoxaparin (LOVENOX) injection  40 mg Subcutaneous Daily  . latanoprost  1 drop Both Eyes QHS  . lip balm  1 application Topical BID   Continuous Infusions: . 0.9 % NaCl with KCl 20 mEq / L 50 mL/hr at 03/15/19 0949  . methocarbamol (ROBAXIN) IV    . ondansetron (ZOFRAN) IV       LOS: 4 days   Time spent= 25 mins    Bonnell Public, MD [facility]  If 7PM-7AM, please contact night-coverage  03/15/2019, 5:20 PM

## 2019-03-15 NOTE — Evaluation (Signed)
Physical Therapy Evaluation Patient Details Name: Christine Kramer MRN: [id number] DOB: 02-09-1921 Today's Date: 03/15/2019   History of Present Illness  85 year old with past medical history of HTN, HLD, GERD, dementia came to the hospital with acute abdominal pain and diagnosed with SBO.  Clinical Impression  Pt admitted with above diagnosis.  Pt currently with functional limitations due to the deficits listed below (see PT Problem List). Pt will benefit from skilled PT to increase their independence and safety with mobility to allow discharge to the venue listed below.  Pt oriented to self only and needed MIN A for transfers and short distance gait. Per chart, she lives with her 70 y/o sister.  Recommend SNF.     Follow Up Recommendations SNF;Supervision/Assistance - 24 hour    Equipment Recommendations  None recommended by PT    Recommendations for Other Services       Precautions / Restrictions Precautions Precautions: Fall Restrictions Weight Bearing Restrictions: No      Mobility  Bed Mobility Overal bed mobility: Needs Assistance Bed Mobility: Supine to Sit     Supine to sit: Min assist     General bed mobility comments: Pt needing MIN A for trunk and cues for get scooted to EOB.  Transfers Overall transfer level: Needs assistance Equipment used: Rolling walker (2 wheeled) Transfers: Sit to/from [facility] Sit to Stand: Min assist Stand pivot transfers: Min assist       General transfer comment: Tactile cueing for proper hand placement with sit to stand. Hand over hand technique for reaching back.  Difficulty with verbal cues, unsure if due to cognition, HOH or both  Ambulation/Gait Ambulation/Gait assistance: Min assist Gait Distance (Feet): 5 Feet Assistive device: Rolling walker (2 wheeled) Gait Pattern/deviations: Trunk flexed;Decreased step length - right;Decreased step length - left     General Gait Details: Flexed trunk and keeping  RW too far forward  Stairs            Wheelchair Mobility    Modified Rankin (Stroke Patients Only)       Balance Overall balance assessment: Needs assistance   Sitting balance-Leahy Scale: Fair       Standing balance-Leahy Scale: Poor Standing balance comment: requires UE support                             Pertinent Vitals/Pain Pain Assessment: Faces Faces Pain Scale: No hurt    Home Living Family/patient expects to be discharged to:: Skilled nursing facility Living Arrangements: Other relatives               Additional Comments: Per chart review, she lives with 75 y/o sister with a nephew checking in on them.  Her husband is in a facility in [place].    Prior Function           Comments: Pt unable to state, but when asked if she used a RW she said yes.     Hand Dominance        Extremity/Trunk Assessment   Upper Extremity Assessment Upper Extremity Assessment: Generalized weakness    Lower Extremity Assessment Lower Extremity Assessment: Generalized weakness    Cervical / Trunk Assessment Cervical / Trunk Assessment: Kyphotic  Communication   Communication: HOH  Cognition     Overall Cognitive Status: History of cognitive impairments - at baseline  General Comments: Pt oriented to self only      General Comments      Exercises     Assessment/Plan    PT Assessment Patient needs continued PT services  PT Problem List Decreased strength;Decreased activity tolerance;Decreased balance;Decreased mobility;Decreased safety awareness;Decreased cognition       PT Treatment Interventions DME instruction;Gait training;Functional mobility training;Neuromuscular re-education;Balance training;Therapeutic exercise;Therapeutic activities;Patient/family education    PT Goals (Current goals can be found in the Care Plan section)  Acute Rehab PT Goals Patient Stated Goal: per chart,  family interested in SNF PT Goal Formulation: Patient unable to participate in goal setting Time For Goal Achievement: 03/28/19 Potential to Achieve Goals: Good    Frequency Min 2X/week   Barriers to discharge Decreased caregiver support Pt lives with 72 year old sister    Co-evaluation               AM-PAC PT "6 Clicks" Mobility  Outcome Measure Help needed turning from your back to your side while in a flat bed without using bedrails?: A Little Help needed moving from lying on your back to sitting on the side of a flat bed without using bedrails?: A Lot Help needed moving to and from a bed to a chair (including a wheelchair)?: A Little Help needed standing up from a chair using your arms (e.g., wheelchair or bedside chair)?: A Little Help needed to walk in hospital room?: A Lot Help needed climbing 3-5 steps with a railing? : A Lot 6 Click Score: 15    End of Session Equipment Utilized During Treatment: Gait belt Activity Tolerance: Patient limited by fatigue Patient left: in chair;with call bell/phone within reach;with chair alarm set Nurse Communication: Mobility status(nurse tech) PT Visit Diagnosis: Unsteadiness on feet (R26.81);Other abnormalities of gait and mobility (R26.89)    Time: [id number] PT Time Calculation (min) (ACUTE ONLY): 15 min   Charges:   PT Evaluation $PT Eval Low Complexity: 1 Low          Destinae Neubecker L. Tamala Julian, [place] Pager [phone] 03/15/2019   Galen Manila 03/15/2019, 3:37 PM

## 2019-03-16 ENCOUNTER — Inpatient Hospital Stay (HOSPITAL_COMMUNITY): Payer: Medicare Other

## 2019-03-16 MED ORDER — ALBUTEROL SULFATE (2.5 MG/3ML) 0.083% IN NEBU
2.5000 mg | INHALATION_SOLUTION | RESPIRATORY_TRACT | Status: DC | PRN
  Administered 2019-03-16: 2.5 mg via RESPIRATORY_TRACT
  Filled 2019-03-16: qty 3

## 2019-03-16 NOTE — Progress Notes (Signed)
[facility] Progress Note     Subjective: CC-  Tolerated soft diet  Denies any abdominal pain.  Had 3 BM's yesterday     Objective: Vital signs in last 24 hours: Temp:  [97.8 F (36.6 C)-99.1 F (37.3 C)] 97.8 F (36.6 C) (11/01 0438) Pulse Rate:  [74-88] 74 (11/01 0438) Resp:  [16-22] 22 (11/01 0438) BP: (129-145)/(65-77) 132/65 (11/01 0438) SpO2:  [98 %-100 %] 98 % (11/01 0530) Last BM Date: 03/14/19  Intake/Output from previous day: 10/31 0701 - 11/01 0700 In: 1566.7 [P.O.:360; I.V.:1206.7] Out: 707 [Urine:704; Stool:3] Intake/Output this shift: No intake/output data recorded.  PE: Gen:  Alert, NAD, pleasant.  Mildly confused consistent with her baseline dementia HEENT: EOM's intact, pupils equal and round.  Hard of hearing Pulm:  Rate and effort normal Abd: Soft.  Non distended.  No abdominal pain/discomfort.  No guarding or peritonitis.     Lab Results:  No results for input(s): WBC, HGB, HCT, PLT in the last 72 hours. BMET Recent Labs    03/14/19 1010 03/15/19 0329  NA 139 136  K 3.1* 3.7  CL 106 105  CO2 25 21*  GLUCOSE 115* 98  BUN 11 10  CREATININE 0.52 0.44  CALCIUM 8.5* 8.8*   PT/INR No results for input(s): LABPROT, INR in the last 72 hours. CMP     Component Value Date/Time   NA 136 03/15/2019 0329   K 3.7 03/15/2019 0329   CL 105 03/15/2019 0329   CO2 21 (L) 03/15/2019 0329   GLUCOSE 98 03/15/2019 0329   BUN 10 03/15/2019 0329   CREATININE 0.44 03/15/2019 0329   CALCIUM 8.8 (L) 03/15/2019 0329   PROT 6.8 03/12/2019 0309   ALBUMIN 2.9 (L) 03/15/2019 0329   AST 15 03/12/2019 0309   ALT 10 03/12/2019 0309   ALKPHOS 60 03/12/2019 0309   BILITOT 1.0 03/12/2019 0309   GFRNONAA >60 03/15/2019 0329   GFRAA >60 03/15/2019 0329   Lipase     Component Value Date/Time   LIPASE 27 03/10/2019 2258       Studies/Results: Dg Chest Port 1 View  Result Date: 03/16/2019 CLINICAL DATA:  Wheezing EXAM: PORTABLE CHEST 1  VIEW COMPARISON:  None. FINDINGS: The heart size and mediastinal contours are within normal limits. Both lungs are clear. The visualized skeletal structures are unremarkable. IMPRESSION: No active disease. Electronically Signed   By: Ulyses Jarred M.D.   On: 03/16/2019 03:29    Anti-infectives: Anti-infectives (From admission, onward)   None       Assessment/Plan HTN Dementia Pancreatic tail cystic mass, ?pancreatic serous cystadenoma  SBO -Improved with nasogastric tube decompression. -Cont reg diet  -No indications for surgery at this time. -ok for d/c from our standpoint   ID - none VTE - SCDs, lovenox FEN - IVF,  Foley - none Pancreas Tail Mass -small mass in pancreas most likely a benign serous cystadenoma.  Not a surgical candidate.  Will defer to primary care physician but most likely would do follow-up scan at the most or not even that given her advanced age and poor candidacy for surgery with low malignancy suspicion  Will sign off.  Please call if there are any questions or changes.  Follow up - PRN     LOS: 5 days      Rosario Adie, MD  [facility]   03/16/2019, 7:59 AM Please see Amion for pager number during day hours 7:00am-4:30pm

## 2019-03-16 NOTE — Progress Notes (Signed)
PROGRESS NOTE    KAYDAN MANCIAS  [id number] DOB: 1921-04-03 DOA: 03/10/2019 PCP: System, Pcp Not In   Brief Narrative:  85 year old with past medical history of HTN, HLD, GERD, dementia came to the hospital with acute abdominal pain.  CT was consistent with small bowel obstruction with transition point in the low mid abdomen, 1.5 cm cystic mass in the pancreatic tail.  Past surgical history of hysterectomy.  NG tube placed, small bowel protocol.  General surgery following.  03/14/2019: NG tube has been removed.  Dysphagia 1 diet controlled by the surgical team.  Surgical team is directing care.  No significant history from patient.  03/15/2019: Patient seen.  Patient has continued to improve.  Diet has been advanced.  Several bowel movements reported overnight.  Surgical team input is appreciated.  Physical therapy input is appreciated, skilled nursing facility recommended.  We will consult social work team.  03/16/2019: Patient remains stable.  Patient is tolerating orally.  Pursue disposition.  Skilled nursing facility recommended.  Assessment & Plan:   Principal Problem:   SBO (small bowel obstruction) (HCC) Active Problems:   Dementia (HCC)   Essential hypertension   OAB (overactive bladder)   Cystic mass of tail pancreas   Hypertension  Small bowel obstruction, acute-conservative management at this time History of constipation -Conservative management at this time.  NG tube to be clamped.  Clear liquid trial.  Out of bed to chair.  Supportive care. -Dulcolax suppository 03/14/2019: NG tube has been removed.  For trial of dysphagia 1 diet.  Further management depend on hospital course. 03/15/2019: Patient has improved significantly.  Pursue disposition.  Skilled nursing facility recommended. 03/16/2019: Stable.  Tolerating orally.  Pursue disposition.  Dementia-chronic and stable  Essential hypertension-continue Norvasc  Hypokalemia: -Resolved.   -Check renal panel in  a.m.  DVT prophylaxis: Lovenox Code Status: Full Family Communication: Spoke with her sister over the phone Disposition Plan: Maintain hospital stay until bowel function is returned to normal and she is tolerating oral diet without any issues.  Unsafe for discharge.  Consultants:   General surgery  Procedures:   None  Antimicrobials:   None  Subjective: No significant history from patient.  Objective: Vitals:   03/15/19 1528 03/15/19 2145 03/16/19 0438 03/16/19 0530  BP: 129/73 (!) 145/77 132/65   Pulse: 85 88 74   Resp: 16 20 (!) 22   Temp: 98.2 F (36.8 C) 99.1 F (37.3 C) 97.8 F (36.6 C)   TempSrc: Oral Oral Oral   SpO2: 100% 99% 100% 98%  Weight:      Height:        Intake/Output Summary (Last 24 hours) at 03/16/2019 1029 Last data filed at 03/16/2019 0800 Gross per 24 hour  Intake 1446.67 ml  Output 1057 ml  Net 389.67 ml   Filed Weights   03/13/19 0300  Weight: 49.8 kg    Examination: Eyes: No pallor. ENMT: Dry buccal mucosa. Neck: Supple.  No raised JVD.   Respiratory: clear to auscultation  Cardiovascular: [id number], systolic murmur.. Abdomen: Nontender.  Hyperactive bowel sounds. Neurologic: Awake and alert.  Patient moves all extremities.    Data Reviewed:   CBC: Recent Labs  Lab 03/10/19 2258 03/11/19 0801 03/12/19 0309  WBC 14.4* 15.9* 8.3  NEUTROABS 12.9*  --  6.1  HGB 13.7 15.5* 15.4*  HCT 40.7 46.0 45.2  MCV 85.3 85.3 83.2  PLT 153 192 [id number]   Basic Metabolic Panel: Recent Labs  Lab 03/10/19 2258 03/11/19 0801 03/12/19 0309  03/14/19 1010 03/15/19 0329  NA 135 135 139 139 136  K 4.0 3.9 4.0 3.1* 3.7  CL 100 101 102 106 105  CO2 27 24 26 25  21*  GLUCOSE 173* 166* 162* 115* 98  BUN 16 15 19 11 10   CREATININE 0.80 0.72 0.78 0.52 0.44  CALCIUM 9.4 9.4 9.1 8.5* 8.8*  MG  --   --  2.3  --  1.9  PHOS  --   --  3.7  --  1.8*   GFR: Estimated Creatinine Clearance: 32.3 mL/min (by C-G formula based on SCr of 0.44 mg/dL).  Liver Function Tests: Recent Labs  Lab 03/10/19 2258 03/12/19 0309 03/15/19 0329  AST 21 15  --   ALT 12 10  --   ALKPHOS 71 60  --   BILITOT 0.7 1.0  --   PROT 7.7 6.8  --   ALBUMIN 4.1 3.5 2.9*   Recent Labs  Lab 03/10/19 2258  LIPASE 27   No results for input(s): AMMONIA in the last 168 hours. Coagulation Profile: No results for input(s): INR, PROTIME in the last 168 hours. Cardiac Enzymes: No results for input(s): CKTOTAL, CKMB, CKMBINDEX, TROPONINI in the last 168 hours. BNP (last 3 results) No results for input(s): PROBNP in the last 8760 hours. HbA1C: No results for input(s): HGBA1C in the last 72 hours. CBG: No results for input(s): GLUCAP in the last 168 hours. Lipid Profile: No results for input(s): CHOL, HDL, LDLCALC, TRIG, CHOLHDL, LDLDIRECT in the last 72 hours. Thyroid Function Tests: No results for input(s): TSH, T4TOTAL, FREET4, T3FREE, THYROIDAB in the last 72 hours. Anemia Panel: No results for input(s): VITAMINB12, FOLATE, FERRITIN, TIBC, IRON, RETICCTPCT in the last 72 hours. Sepsis Labs: No results for input(s): PROCALCITON, LATICACIDVEN in the last 168 hours.  Recent Results (from the past 240 hour(s))  SARS CORONAVIRUS 2 (TAT 6-24 HRS) Nasopharyngeal Nasopharyngeal Swab     Status: None   Collection Time: 03/11/19  1:41 AM   Specimen: Nasopharyngeal Swab  Result Value Ref Range Status   SARS Coronavirus 2 NEGATIVE NEGATIVE Final    Comment: (NOTE) SARS-CoV-2 target nucleic acids are NOT DETECTED. The SARS-CoV-2 RNA is generally detectable in upper and lower respiratory specimens during the acute phase of infection. Negative results do not preclude SARS-CoV-2 infection, do not rule out co-infections with other pathogens, and should not be used as the sole basis for treatment or other patient management decisions. Negative results must be combined with clinical observations, patient history, and epidemiological information. The expected  result is Negative. Fact Sheet for Patients: SugarRoll.be Fact Sheet for Healthcare Providers: https://www.woods-mathews.com/ This test is not yet approved or cleared by the [place] FDA and  has been authorized for detection and/or diagnosis of SARS-CoV-2 by FDA under an Emergency Use Authorization (EUA). This EUA will remain  in effect (meaning this test can be used) for the duration of the COVID-19 declaration under Section 56 4(b)(1) of the Act, 21 U.S.C. section 360bbb-3(b)(1), unless the authorization is terminated or revoked sooner. Performed at [facility], [place] [address], [place], [place] [address]          Radiology Studies: Dg Chest Port 1 View  Result Date: 03/16/2019 CLINICAL DATA:  Wheezing EXAM: PORTABLE CHEST 1 VIEW COMPARISON:  None. FINDINGS: The heart size and mediastinal contours are within normal limits. Both lungs are clear. The visualized skeletal structures are unremarkable. IMPRESSION: No active disease. Electronically Signed   By: Cletus Gash.D.  On: 03/16/2019 03:29        Scheduled Meds: . amLODipine  5 mg Oral Daily  . aspirin  81 mg Oral Daily  . bisacodyl  10 mg Rectal Daily  . enoxaparin (LOVENOX) injection  40 mg Subcutaneous Daily  . latanoprost  1 drop Both Eyes QHS  . lip balm  1 application Topical BID   Continuous Infusions: . 0.9 % NaCl with KCl 20 mEq / L 50 mL/hr at 03/16/19 0614  . methocarbamol (ROBAXIN) IV    . ondansetron (ZOFRAN) IV       LOS: 5 days   Time spent= 25 mins    Bonnell Public, MD [facility]  If 7PM-7AM, please contact night-coverage  03/16/2019, 10:29 AM

## 2019-03-16 NOTE — Progress Notes (Addendum)
While helping patient onto the bedpan she began to have audible wheezing and verbalized when asked that she was having trouble breathing. Her O2 sat is 98. Paged respiratory to bedside, they recommended to follow up with on call to obtain chest xray. MD was paged and has order chest xray. Patient remains stable in bed. Will continue to monitor.

## 2019-03-16 NOTE — Progress Notes (Signed)
Patient is still experiencing wheezing and labored breathing. Her sat is 99 on RA. MD paged again, awaiting new orders.

## 2019-03-17 LAB — CBC WITH DIFFERENTIAL/PLATELET
Abs Immature Granulocytes: 0.06 10*3/uL (ref 0.00–0.07)
Basophils Absolute: 0 10*3/uL (ref 0.0–0.1)
Basophils Relative: 0 %
Eosinophils Absolute: 0.2 10*3/uL (ref 0.0–0.5)
Eosinophils Relative: 3 %
HCT: 34.2 % — ABNORMAL LOW (ref 36.0–46.0)
Hemoglobin: 11.4 g/dL — ABNORMAL LOW (ref 12.0–15.0)
Immature Granulocytes: 1 %
Lymphocytes Relative: 23 %
Lymphs Abs: 1.7 10*3/uL (ref 0.7–4.0)
MCH: 28.1 pg (ref 26.0–34.0)
MCHC: 33.3 g/dL (ref 30.0–36.0)
MCV: 84.4 fL (ref 80.0–100.0)
Monocytes Absolute: 1 10*3/uL (ref 0.1–1.0)
Monocytes Relative: 13 %
Neutro Abs: 4.3 10*3/uL (ref 1.7–7.7)
Neutrophils Relative %: 60 %
Platelets: 153 10*3/uL (ref 150–400)
RBC: 4.05 MIL/uL (ref 3.87–5.11)
RDW: 12.6 % (ref 11.5–15.5)
WBC: 7.4 10*3/uL (ref 4.0–10.5)
nRBC: 0 % (ref 0.0–0.2)

## 2019-03-17 LAB — RENAL FUNCTION PANEL
Albumin: 2.9 g/dL — ABNORMAL LOW (ref 3.5–5.0)
Anion gap: 9 (ref 5–15)
BUN: 10 mg/dL (ref 8–23)
CO2: 23 mmol/L (ref 22–32)
Calcium: 8.7 mg/dL — ABNORMAL LOW (ref 8.9–10.3)
Chloride: 106 mmol/L (ref 98–111)
Creatinine, Ser: 0.5 mg/dL (ref 0.44–1.00)
GFR calc Af Amer: >60 mL/min (ref >60)
GFR calc non Af Amer: >60 mL/min (ref >60)
Glucose, Bld: 94 mg/dL (ref 70–99)
Phosphorus: 3.4 mg/dL (ref 2.5–4.6)
Potassium: 3.8 mmol/L (ref 3.5–5.1)
Sodium: 138 mmol/L (ref 135–145)

## 2019-03-17 LAB — MAGNESIUM: Magnesium: 1.6 mg/dL — ABNORMAL LOW (ref 1.7–2.4)

## 2019-03-17 NOTE — TOC Initial Note (Signed)
Transition of Care [facility]) - Initial/Assessment Note    Patient Details  Name: Christine Kramer MRN: [id number] Date of Birth: 19-Oct-1920  [facility] CM/SW Contact:    Joaquin Courts, RN Phone Number: 03/17/2019, 11:43 AM  Clinical Narrative:   CM reached out to POA x2 and left voicemail with no response. CM attempting to confirm discharge plan for patient. There is a recommendation in place for SNF/24 hour care, however CM was informed by bedside RN that POA has expressed that he wants patient to return to [place] with her husband. At this time dc disposition is unclear, will continue attempting to reach POA.                 Expected Discharge Plan: Home/Self Care Barriers to Discharge: Continued Medical Work up   Patient Goals and CMS Choice Patient states their goals for this hospitalization and ongoing recovery are:: (patient has dementia, unable to reach POA)      Expected Discharge Plan and Services Expected Discharge Plan: Home/Self Care   Discharge Planning Services: CM Consult   Living arrangements for the past 2 months: Single Family Home                                      Prior Living Arrangements/Services Living arrangements for the past 2 months: Single Family Home Lives with:: Siblings Patient language and need for interpreter reviewed:: Yes              Criminal Activity/Legal Involvement Pertinent to Current Situation/Hospitalization: No - Comment as needed  Activities of Daily Living Home Assistive Devices/Equipment: Cane (specify quad or straight), Walker (specify type)(single point cane, front wheeled walker) ADL Screening (condition at time of admission) Patient's cognitive ability adequate to safely complete daily activities?: No Is the patient deaf or have difficulty hearing?: No Does the patient have difficulty seeing, even when wearing glasses/contacts?: Yes(patient has glaucoma) Does the patient have difficulty  concentrating, remembering, or making decisions?: Yes Patient able to express need for assistance with ADLs?: Yes Does the patient have difficulty dressing or bathing?: Yes Independently performs ADLs?: No Communication: Independent Dressing (OT): Needs assistance Is this a change from baseline?: Pre-admission baseline Grooming: Needs assistance Is this a change from baseline?: Pre-admission baseline Feeding: Needs assistance Is this a change from baseline?: Pre-admission baseline Bathing: Needs assistance Is this a change from baseline?: Pre-admission baseline Toileting: Needs assistance Is this a change from baseline?: Pre-admission baseline In/Out Bed: Needs assistance Is this a change from baseline?: Pre-admission baseline Walks in Home: Needs assistance Is this a change from baseline?: Pre-admission baseline Does the patient have difficulty walking or climbing stairs?: Yes(secondary to weakness) Weakness of Legs: Both Weakness of Arms/Hands: None  Permission Sought/Granted                  Emotional Assessment           Psych Involvement: No (comment)  Admission diagnosis:  Small bowel obstruction ([place]) [K56.609] SBO (small bowel obstruction) ([place]) [K56.609] Encounter for imaging study to confirm nasogastric (NG) tube placement [Z01.89] Patient Active Problem List   Diagnosis Date Noted  . Hypertension   . SBO (small bowel obstruction) ([place]) 03/11/2019  . Cystic mass of tail pancreas 03/11/2019  . Subarachnoid hemorrhage ([place]) 04/14/2016  . Essential hypertension 03/16/2015  . OAB (overactive bladder) 03/16/2015  . Glaucoma 03/16/2015  . Hyperlipidemia 03/10/2015  .  Dementia ([place]) 03/10/2015   PCP:  System, Pcp Not In Pharmacy:   [facility], [place] [address] [place] [address] Phone: [phone] Fax: [phone]     Social Determinants of Health (SDOH)  Interventions    Readmission Risk Interventions No flowsheet data found.

## 2019-03-17 NOTE — Progress Notes (Signed)
PROGRESS NOTE    Christine Kramer  [id number] DOB: January 23, 1921 DOA: 03/10/2019 PCP: System, Pcp Not In   Brief Narrative:  85 year old with past medical history of HTN, HLD, GERD, dementia came to the hospital with acute abdominal pain.  CT was consistent with small bowel obstruction with transition point in the low mid abdomen, 1.5 cm cystic mass in the pancreatic tail.  Past surgical history of hysterectomy.  NG tube placed, small bowel protocol.  General surgery following.  03/14/2019: NG tube has been removed.  Dysphagia 1 diet controlled by the surgical team.  Surgical team is directing care.  No significant history from patient.  03/15/2019: Patient seen.  Patient has continued to improve.  Diet has been advanced.  Several bowel movements reported overnight.  Surgical team input is appreciated.  Physical therapy input is appreciated, skilled nursing facility recommended.  We will consult social work team.  03/16/2019: Patient remains stable.  Patient is tolerating orally.  Pursue disposition.  Skilled nursing facility recommended.  03/17/2019: Patient seen.  No new changes.  Patient stable for discharge.  Pursue disposition.  The plan is to discharge patient to skilled nursing facility.  Assessment & Plan:   Principal Problem:   SBO (small bowel obstruction) (HCC) Active Problems:   Dementia (HCC)   Essential hypertension   OAB (overactive bladder)   Cystic mass of tail pancreas   Hypertension  Small bowel obstruction, acute-conservative management at this time History of constipation -Conservative management at this time.  NG tube to be clamped.  Clear liquid trial.  Out of bed to chair.  Supportive care. -Dulcolax suppository 03/14/2019: NG tube has been removed.  For trial of dysphagia 1 diet.  Further management depend on hospital course. 03/15/2019: Patient has improved significantly.  Pursue disposition.  Skilled nursing facility recommended. 03/17/2019: Stable.   Tolerating orally.  Pursue disposition.  Dementia-chronic and stable  Essential hypertension-continue Norvasc  Hypokalemia: -Resolved.   -Potassium is 3.8 today.    DVT prophylaxis: Lovenox Code Status: Full Family Communication: Spoke with her sister over the phone Disposition Plan: Maintain hospital stay until bowel function is returned to normal and she is tolerating oral diet without any issues.  Unsafe for discharge.  Consultants:   General surgery  Procedures:   None  Antimicrobials:   None  Subjective: No significant history from patient.  Objective: Vitals:   03/16/19 1346 03/16/19 2122 03/17/19 0837 03/17/19 1400  BP: 138/62 (!) 147/77 (!) 157/66 (!) 124/96  Pulse: 68 75 82 75  Resp: 19 16 16 16   Temp: 97.9 F (36.6 C) 98.2 F (36.8 C) 98.2 F (36.8 C) 98.6 F (37 C)  TempSrc: Oral Oral Axillary Oral  SpO2: 99% 100% 100% 99%  Weight:      Height:        Intake/Output Summary (Last 24 hours) at 03/17/2019 1706 Last data filed at 03/17/2019 1356 Gross per 24 hour  Intake 1988.2 ml  Output 2050 ml  Net -61.8 ml   Filed Weights   03/13/19 0300  Weight: 49.8 kg    Examination: Eyes: No pallor. ENMT: Dry buccal mucosa. Neck: Supple.  No raised JVD.   Respiratory: clear to auscultation  Cardiovascular: [id number], systolic murmur.. Abdomen: Nontender.  Hyperactive bowel sounds. Neurologic: Awake and alert.  Patient moves all extremities.    Data Reviewed:   CBC: Recent Labs  Lab 03/10/19 2258 03/11/19 0801 03/12/19 0309 03/17/19 0606  WBC 14.4* 15.9* 8.3 7.4  NEUTROABS 12.9*  --  6.1  4.3  HGB 13.7 15.5* 15.4* 11.4*  HCT 40.7 46.0 45.2 34.2*  MCV 85.3 85.3 83.2 84.4  PLT 153 192 199 0000000   Basic Metabolic Panel: Recent Labs  Lab 03/11/19 0801 03/12/19 0309 03/14/19 1010 03/15/19 0329 03/17/19 0606  NA 135 139 139 136 138  K 3.9 4.0 3.1* 3.7 3.8  CL 101 102 106 105 106  CO2 24 26 25  21* 23  GLUCOSE 166* 162* 115* 98 94  BUN 15  19 11 10 10   CREATININE 0.72 0.78 0.52 0.44 0.50  CALCIUM 9.4 9.1 8.5* 8.8* 8.7*  MG  --  2.3  --  1.9 1.6*  PHOS  --  3.7  --  1.8* 3.4   GFR: Estimated Creatinine Clearance: 32.3 mL/min (by C-G formula based on SCr of 0.5 mg/dL). Liver Function Tests: Recent Labs  Lab 03/10/19 2258 03/12/19 0309 03/15/19 0329 03/17/19 0606  AST 21 15  --   --   ALT 12 10  --   --   ALKPHOS 71 60  --   --   BILITOT 0.7 1.0  --   --   PROT 7.7 6.8  --   --   ALBUMIN 4.1 3.5 2.9* 2.9*   Recent Labs  Lab 03/10/19 2258  LIPASE 27   No results for input(s): AMMONIA in the last 168 hours. Coagulation Profile: No results for input(s): INR, PROTIME in the last 168 hours. Cardiac Enzymes: No results for input(s): CKTOTAL, CKMB, CKMBINDEX, TROPONINI in the last 168 hours. BNP (last 3 results) No results for input(s): PROBNP in the last 8760 hours. HbA1C: No results for input(s): HGBA1C in the last 72 hours. CBG: No results for input(s): GLUCAP in the last 168 hours. Lipid Profile: No results for input(s): CHOL, HDL, LDLCALC, TRIG, CHOLHDL, LDLDIRECT in the last 72 hours. Thyroid Function Tests: No results for input(s): TSH, T4TOTAL, FREET4, T3FREE, THYROIDAB in the last 72 hours. Anemia Panel: No results for input(s): VITAMINB12, FOLATE, FERRITIN, TIBC, IRON, RETICCTPCT in the last 72 hours. Sepsis Labs: No results for input(s): PROCALCITON, LATICACIDVEN in the last 168 hours.  Recent Results (from the past 240 hour(s))  SARS CORONAVIRUS 2 (TAT 6-24 HRS) Nasopharyngeal Nasopharyngeal Swab     Status: None   Collection Time: 03/11/19  1:41 AM   Specimen: Nasopharyngeal Swab  Result Value Ref Range Status   SARS Coronavirus 2 NEGATIVE NEGATIVE Final    Comment: (NOTE) SARS-CoV-2 target nucleic acids are NOT DETECTED. The SARS-CoV-2 RNA is generally detectable in upper and lower respiratory specimens during the acute phase of infection. Negative results do not preclude SARS-CoV-2  infection, do not rule out co-infections with other pathogens, and should not be used as the sole basis for treatment or other patient management decisions. Negative results must be combined with clinical observations, patient history, and epidemiological information. The expected result is Negative. Fact Sheet for Patients: SugarRoll.be Fact Sheet for Healthcare Providers: https://www.woods-mathews.com/ This test is not yet approved or cleared by the [place] FDA and  has been authorized for detection and/or diagnosis of SARS-CoV-2 by FDA under an Emergency Use Authorization (EUA). This EUA will remain  in effect (meaning this test can be used) for the duration of the COVID-19 declaration under Section 56 4(b)(1) of the Act, 21 U.S.C. section 360bbb-3(b)(1), unless the authorization is terminated or revoked sooner. Performed at [facility], [place] [address] [place], [place] [address]          Radiology Studies: Dg  Chest Port 1 View  Result Date: 03/16/2019 CLINICAL DATA:  Wheezing EXAM: PORTABLE CHEST 1 VIEW COMPARISON:  None. FINDINGS: The heart size and mediastinal contours are within normal limits. Both lungs are clear. The visualized skeletal structures are unremarkable. IMPRESSION: No active disease. Electronically Signed   By: Ulyses Jarred M.D.   On: 03/16/2019 03:29        Scheduled Meds: . amLODipine  5 mg Oral Daily  . aspirin  81 mg Oral Daily  . bisacodyl  10 mg Rectal Daily  . enoxaparin (LOVENOX) injection  40 mg Subcutaneous Daily  . latanoprost  1 drop Both Eyes QHS  . lip balm  1 application Topical BID   Continuous Infusions: . 0.9 % NaCl with KCl 20 mEq / L 50 mL/hr at 03/17/19 0153  . methocarbamol (ROBAXIN) IV    . ondansetron (ZOFRAN) IV       LOS: 6 days   Time spent= 15 mins    Bonnell Public, MD [facility]  If 7PM-7AM, please contact night-coverage  03/17/2019, 5:06 PM

## 2019-03-17 NOTE — Care Management Important Message (Signed)
Important Message  Patient Details IM Letter given to [name] RN to present to the Patient Name: Christine Kramer MRN: [id number] Date of Birth: [birth year]   Medicare Important Message Given:  Yes     [name] 03/17/2019, 10:44 AM

## 2019-03-17 NOTE — TOC Progression Note (Signed)
Transition of Care [facility]) - Progression Note    Patient Details  Name: Christine Kramer MRN: [id number] Date of Birth: 09-08-1920  Transition of Care [facility]) CM/SW Contact  Joaquin Courts, RN Phone Number: 03/17/2019, 3:51 PM  Clinical Narrative:    CM spoke with Daphene Calamity, who reports that patient has been living with her 83 year old sister who has been taking care of her. Elta Guadeloupe has obtained HCPOA on the patient after the death if her son and has been working with an attorney to help with placing patient in a long term care facility in [place] where her husband lives.  They are currently in the process of getting long term medicaid and finalizing finances to make this possible. at this time he would like the patient to go to a local SNF for short term rehab as recommended with a goal to transition to the [place] facility after rehab is complete. FL2 faxed out to area SNF.      Expected Discharge Plan: [place] Barriers to Discharge: Continued Medical Work up  Expected Discharge Plan and Services Expected Discharge Plan: [place]   Discharge Planning Services: CM Consult Post Acute Care Choice: [place] Living arrangements for the past 2 months: Single Family Home                 DME Arranged: N/A DME Agency: NA       HH Arranged: NA HH Agency: NA         Social Determinants of Health (SDOH) Interventions    Readmission Risk Interventions No flowsheet data found.

## 2019-03-17 NOTE — NC FL2 (Addendum)
[place] LEVEL OF CARE SCREENING TOOL     IDENTIFICATION  Patient Name: Christine Kramer Birthdate: 12-14-1920 Sex: female Admission Date (Current Location): 03/10/2019  [facility] and [place] Number:  Herbalist and Address:  [facility],  [place], [place]      Provider Number: [id number]  Attending Physician Name and Address:  Bonnell Public, MD  Relative Name and Phone Number:  Lonia Farber [phone]    Current Level of Care: Hospital Recommended Level of Care: [place] Prior Approval Number:    Date Approved/Denied:   PASRR Number: [id number]  Discharge Plan: SNF    Current Diagnoses: Patient Active Problem List   Diagnosis Date Noted  . Hypertension   . SBO (small bowel obstruction) ([place]) 03/11/2019  . Cystic mass of tail pancreas 03/11/2019  . Subarachnoid hemorrhage ([place]) 04/14/2016  . Essential hypertension 03/16/2015  . OAB (overactive bladder) 03/16/2015  . Glaucoma 03/16/2015  . Hyperlipidemia 03/10/2015  . Dementia ([place]) 03/10/2015    Orientation RESPIRATION BLADDER Height & Weight     Self  Normal Incontinent Weight: 49.8 kg Height:  5\' 7"  (170.2 cm)  BEHAVIORAL SYMPTOMS/MOOD NEUROLOGICAL BOWEL NUTRITION STATUS      Incontinent Diet  AMBULATORY STATUS COMMUNICATION OF NEEDS Skin   Limited Assist Verbally Normal                       Personal Care Assistance Level of Assistance  Bathing, Dressing, Total care Bathing Assistance: Maximum assistance   Dressing Assistance: Limited assistance Total Care Assistance: Limited assistance   Functional Limitations Info             SPECIAL CARE FACTORS FREQUENCY  PT (By licensed PT), OT (By licensed OT)     PT Frequency: 5x weekly OT Frequency: 5x weekly            Contractures Contractures Info: Not present    Additional Factors Info  Code Status Code Status Info: Full             Current  Medications (03/17/2019):  This is the current hospital active medication list Current Facility-Administered Medications  Medication Dose Route Frequency Provider Last Rate Last Dose  . 0.9 % NaCl with KCl 20 mEq/ L  infusion   Intravenous Continuous Dana Allan I, MD 50 mL/hr at 03/17/19 0153    . acetaminophen (TYLENOL) tablet 650 mg  650 mg Oral Q6H PRN Etta Quill, DO   650 mg at 03/17/19 1050   Or  . acetaminophen (TYLENOL) suppository 650 mg  650 mg Rectal Q6H PRN Etta Quill, DO      . albuterol (PROVENTIL) (2.5 MG/3ML) 0.083% nebulizer solution 2.5 mg  2.5 mg Nebulization Q4H PRN Jani Gravel, MD   2.5 mg at 03/16/19 0530  . alum & mag hydroxide-simeth (MAALOX/MYLANTA) 200-200-20 MG/5ML suspension 30 mL  30 mL Oral Q6H PRN Michael Boston, MD      . amLODipine (NORVASC) tablet 5 mg  5 mg Oral Daily Jennette Kettle M, DO   5 mg at 03/17/19 0912  . aspirin chewable tablet 81 mg  81 mg Oral Daily Etta Quill, DO   81 mg at 03/17/19 [id number]  . bisacodyl (DULCOLAX) suppository 10 mg  10 mg Rectal Daily Michael Boston, MD   10 mg at 03/14/19 0955  . enoxaparin (LOVENOX) injection 40 mg  40 mg Subcutaneous Daily Etta Quill, DO  40 mg at 03/17/19 0912  . guaiFENesin-dextromethorphan (ROBITUSSIN DM) 100-10 MG/5ML syrup 10 mL  10 mL Oral Q4H PRN Michael Boston, MD      . hydrocortisone (ANUSOL-HC) 2.5 % rectal cream 1 application  1 application Topical QID PRN Michael Boston, MD      . hydrocortisone cream 1 % 1 application  1 application Topical TID PRN Michael Boston, MD      . latanoprost (XALATAN) 0.005 % ophthalmic solution 1 drop  1 drop Both Eyes QHS Jennette Kettle M, DO   1 drop at 03/16/19 2121  . lip balm (CARMEX) ointment 1 application  1 application Topical BID Michael Boston, MD   1 application at [id number]  . magic mouthwash  15 mL Oral QID PRN Michael Boston, MD      . menthol-cetylpyridinium (CEPACOL) lozenge 3 mg  1 lozenge Oral PRN Michael Boston, MD      .  methocarbamol (ROBAXIN) 1,000 mg in dextrose 5 % 100 mL IVPB  1,000 mg Intravenous Q6H PRN Michael Boston, MD      . ondansetron (ZOFRAN) injection 4 mg  4 mg Intravenous Q6H PRN Michael Boston, MD       Or  . ondansetron (ZOFRAN) 8 mg in sodium chloride 0.9 % 50 mL IVPB  8 mg Intravenous Q6H PRN Michael Boston, MD      . ondansetron (ZOFRAN) tablet 4 mg  4 mg Oral Q6H PRN Etta Quill, DO      . phenol (CHLORASEPTIC) mouth spray 1-2 spray  1-2 spray Mouth/Throat PRN Michael Boston, MD      . prochlorperazine (COMPAZINE) injection 5-10 mg  5-10 mg Intravenous Q4H PRN Michael Boston, MD      . simethicone (MYLICON) 40 [id number] suspension 40 mg  40 mg Oral QID PRN Michael Boston, MD         Discharge Medications: Please see discharge summary for a list of discharge medications.  Relevant Imaging Results:  Relevant Lab Results:   Additional Information SSN [id number]  Joaquin Courts, RN

## 2019-03-18 LAB — SARS CORONAVIRUS 2 (TAT 6-24 HRS): SARS Coronavirus 2: NEGATIVE

## 2019-03-18 NOTE — Progress Notes (Signed)
Physical Therapy Treatment Patient Details Name: Christine Kramer REFFNER MRN: [id number] DOB: 1921-01-15 Today's Date: 03/18/2019    History of Present Illness 85 year old with past medical history of HTN, HLD, GERD, dementia came to the hospital with acute abdominal pain and diagnosed with SBO.    PT Comments    Patient progressed with therapy some and able to perform transfers with  Min assist using RW, however patient is more steady with min assist and 2 person HHA assist as she continues to have difficulty managing RW safely. She was instructed in exercises for LE strengthening while sitting in chair and required tactile and visual cues to complete properly. Patient ambulated short distance forwards and backwards with 2 HHA and mod assist to prevent LOB. She was limited by fatigue and LE weakness. Patient will benefit from continued skilled PT at below setting. Acute PT will continue to progress as able.   Follow Up Recommendations  SNF;Supervision/Assistance - 24 hour     Equipment Recommendations  None recommended by PT    Recommendations for Other Services       Precautions / Restrictions Precautions Precautions: Fall Restrictions Weight Bearing Restrictions: No    Mobility  Bed Mobility Overal bed mobility: Needs Assistance Bed Mobility: Supine to Sit     Supine to sit: Min assist;HOB elevated     General bed mobility comments: verbal/tactile cues needed to sequence bed mobility and assist for LE scooting and trunk raise, cues to scoot to EOB requried  Transfers Overall transfer level: Needs assistance Equipment used: Rolling walker (2 wheeled);2 person hand held assist Transfers: Sit to/from [facility] Sit to Stand: Min assist Stand pivot transfers: Min assist       General transfer comment: verbal/tactile cues requried for hand placement and sequencing transfer with RW; pt with difficulty using RW for trasnfers; sit<>stand and stand pivot performed  with 2 HHA as well, pt with more upright posture with 2 person assist and improved ability to sequence step pattern  Ambulation/Gait Ambulation/Gait assistance: Mod assist;+2 physical assistance;+2 safety/equipment Gait Distance (Feet): 8 Feet Assistive device: 2 person hand held assist Gait Pattern/deviations: Trunk flexed;Decreased step length - right;Decreased step length - left Gait velocity: indicative of fall risk   General Gait Details: gait performed with 2 person HHA and pt required verbal cues to sequence step pattern forwards and backwards, pt limited by fatigue, she required mod assist to maintain balance due to LE weakness and posture became more flexed as she fatigued; pt safer with 2 person assist rather than RW   Stairs             Wheelchair Mobility    Modified Rankin (Stroke Patients Only)       Balance Overall balance assessment: Needs assistance Sitting-balance support: Feet supported Sitting balance-Leahy Scale: Fair     Standing balance support: Bilateral upper extremity supported;During functional activity Standing balance-Leahy Scale: Poor Standing balance comment: requires UE support           Cognition Arousal/Alertness: Awake/alert Behavior During Therapy: WFL for tasks assessed/performed Overall Cognitive Status: History of cognitive impairments - at baseline        General Comments: Pt oriented to self only      Exercises General Exercises - Lower Extremity Ankle Circles/Pumps: AROM;10 reps;Both;Seated Long Arc Quad: AROM;10 reps;Seated;Both Hip Flexion/Marching: AROM;Both;10 reps;Seated    General Comments        Pertinent Vitals/Pain             PT Goals (  current goals can now be found in the care plan section) Acute Rehab PT Goals Patient Stated Goal: per chart, family interested in SNF PT Goal Formulation: Patient unable to participate in goal setting Time For Goal Achievement: 03/28/19 Potential to Achieve  Goals: Good Progress towards PT goals: Progressing toward goals    Frequency    Min 2X/week      PT Plan Current plan remains appropriate       AM-PAC PT "6 Clicks" Mobility   Outcome Measure  Help needed turning from your back to your side while in a flat bed without using bedrails?: A Little Help needed moving from lying on your back to sitting on the side of a flat bed without using bedrails?: A Lot Help needed moving to and from a bed to a chair (including a wheelchair)?: A Little Help needed standing up from a chair using your arms (e.g., wheelchair or bedside chair)?: A Little Help needed to walk in hospital room?: A Lot Help needed climbing 3-5 steps with a railing? : A Lot 6 Click Score: 15    End of Session Equipment Utilized During Treatment: Gait belt Activity Tolerance: Patient limited by fatigue Patient left: in chair;with call bell/phone within reach;with chair alarm set Nurse Communication: Mobility status(nurse tech) PT Visit Diagnosis: Unsteadiness on feet (R26.81);Other abnormalities of gait and mobility (R26.89)     Time: [id number] PT Time Calculation (min) (ACUTE ONLY): 35 min  Charges:  $Therapeutic Activity: 23-37 mins                     Kipp Brood, PT, DPT Physical Therapist with [facility]  03/18/2019 2:51 PM

## 2019-03-18 NOTE — Progress Notes (Signed)
PROGRESS NOTE    Christine Kramer  [id number] DOB: Oct 25, 1920 DOA: 03/10/2019 PCP: System, Pcp Not In   Brief Narrative:  85 year old with past medical history of HTN, HLD, GERD, dementia came to the hospital with acute abdominal pain.  CT was consistent with small bowel obstruction with transition point in the low mid abdomen, 1.5 cm cystic mass in the pancreatic tail.  Past surgical history of hysterectomy.  NG tube placed, small bowel protocol.  Patient symptoms have resolved.  NG tube has been discontinued.  General surgery assisted in directing patient's care.  Diet has been advanced.  Patient is stable for discharge.  Repeat COVID-19 test ordered.  Patient will be discharged to skilled nursing facility.  Assessment & Plan:   Principal Problem:   SBO (small bowel obstruction) (HCC) Active Problems:   Dementia (HCC)   Essential hypertension   OAB (overactive bladder)   Cystic mass of tail pancreas   Hypertension  Small bowel obstruction, acute-conservative management at this time History of constipation -Conservative management at this time.  NG tube to be clamped.  Clear liquid trial.  Out of bed to chair.  Supportive care. -Dulcolax suppository 03/14/2019: NG tube has been removed.  For trial of dysphagia 1 diet.  Further management depend on hospital course. 03/18/2019: Stable.  Tolerating orally.  Pursue disposition.  Plan is to discharge patient to skilled nursing facility.  Dementia-chronic and stable  Essential hypertension-continue Norvasc  Hypokalemia: -Resolved.   -Potassium was 3.8 on 03/17/2019.    DVT prophylaxis: Lovenox Code Status: Full Family Communication:  Disposition Plan: Skilled nursing facility  Consultants:   General surgery  Procedures:   None  Antimicrobials:   None  Subjective: No significant history from patient.  Objective: Vitals:   03/17/19 0837 03/17/19 1400 03/17/19 2219 03/18/19 0421  BP: (!) 157/66 (!) 124/96 138/89 (!)  158/70  Pulse: 82 75 86 75  Resp: 16 16 18 12   Temp: 98.2 F (36.8 C) 98.6 F (37 C) 98.5 F (36.9 C) 97.8 F (36.6 C)  TempSrc: Axillary Oral Oral Oral  SpO2: 100% 99% 99% 100%  Weight:      Height:        Intake/Output Summary (Last 24 hours) at 03/18/2019 1231 Last data filed at 03/18/2019 1035 Gross per 24 hour  Intake 1478.81 ml  Output 1600 ml  Net -121.19 ml   Filed Weights   03/13/19 0300  Weight: 49.8 kg    Examination: Eyes: No pallor. ENMT: Dry buccal mucosa. Neck: Supple.  No raised JVD.   Respiratory: clear to auscultation  Cardiovascular: [id number], systolic murmur.. Abdomen: Nontender.  Hyperactive bowel sounds. Neurologic: Awake and alert.  Patient moves all extremities.    Data Reviewed:   CBC: Recent Labs  Lab 03/12/19 0309 03/17/19 0606  WBC 8.3 7.4  NEUTROABS 6.1 4.3  HGB 15.4* 11.4*  HCT 45.2 34.2*  MCV 83.2 84.4  PLT 199 0000000   Basic Metabolic Panel: Recent Labs  Lab 03/12/19 0309 03/14/19 1010 03/15/19 0329 03/17/19 0606  NA 139 139 136 138  K 4.0 3.1* 3.7 3.8  CL 102 106 105 106  CO2 26 25 21* 23  GLUCOSE 162* 115* 98 94  BUN 19 11 10 10   CREATININE 0.78 0.52 0.44 0.50  CALCIUM 9.1 8.5* 8.8* 8.7*  MG 2.3  --  1.9 1.6*  PHOS 3.7  --  1.8* 3.4   GFR: Estimated Creatinine Clearance: 32.3 mL/min (by C-G formula based on SCr of 0.5 mg/dL).  Liver Function Tests: Recent Labs  Lab 03/12/19 0309 03/15/19 0329 03/17/19 0606  AST 15  --   --   ALT 10  --   --   ALKPHOS 60  --   --   BILITOT 1.0  --   --   PROT 6.8  --   --   ALBUMIN 3.5 2.9* 2.9*   No results for input(s): LIPASE, AMYLASE in the last 168 hours. No results for input(s): AMMONIA in the last 168 hours. Coagulation Profile: No results for input(s): INR, PROTIME in the last 168 hours. Cardiac Enzymes: No results for input(s): CKTOTAL, CKMB, CKMBINDEX, TROPONINI in the last 168 hours. BNP (last 3 results) No results for input(s): PROBNP in the last 8760 hours.  HbA1C: No results for input(s): HGBA1C in the last 72 hours. CBG: No results for input(s): GLUCAP in the last 168 hours. Lipid Profile: No results for input(s): CHOL, HDL, LDLCALC, TRIG, CHOLHDL, LDLDIRECT in the last 72 hours. Thyroid Function Tests: No results for input(s): TSH, T4TOTAL, FREET4, T3FREE, THYROIDAB in the last 72 hours. Anemia Panel: No results for input(s): VITAMINB12, FOLATE, FERRITIN, TIBC, IRON, RETICCTPCT in the last 72 hours. Sepsis Labs: No results for input(s): PROCALCITON, LATICACIDVEN in the last 168 hours.  Recent Results (from the past 240 hour(s))  SARS CORONAVIRUS 2 (TAT 6-24 HRS) Nasopharyngeal Nasopharyngeal Swab     Status: None   Collection Time: 03/11/19  1:41 AM   Specimen: Nasopharyngeal Swab  Result Value Ref Range Status   SARS Coronavirus 2 NEGATIVE NEGATIVE Final    Comment: (NOTE) SARS-CoV-2 target nucleic acids are NOT DETECTED. The SARS-CoV-2 RNA is generally detectable in upper and lower respiratory specimens during the acute phase of infection. Negative results do not preclude SARS-CoV-2 infection, do not rule out co-infections with other pathogens, and should not be used as the sole basis for treatment or other patient management decisions. Negative results must be combined with clinical observations, patient history, and epidemiological information. The expected result is Negative. Fact Sheet for Patients: SugarRoll.be Fact Sheet for Healthcare Providers: https://www.woods-mathews.com/ This test is not yet approved or cleared by the [place] FDA and  has been authorized for detection and/or diagnosis of SARS-CoV-2 by FDA under an Emergency Use Authorization (EUA). This EUA will remain  in effect (meaning this test can be used) for the duration of the COVID-19 declaration under Section 56 4(b)(1) of the Act, 21 U.S.C. section 360bbb-3(b)(1), unless the authorization is terminated or  revoked sooner. Performed at [facility], [place] [address], [place], [place] [address]          Radiology Studies: No results found.      Scheduled Meds: . amLODipine  5 mg Oral Daily  . aspirin  81 mg Oral Daily  . bisacodyl  10 mg Rectal Daily  . enoxaparin (LOVENOX) injection  40 mg Subcutaneous Daily  . latanoprost  1 drop Both Eyes QHS  . lip balm  1 application Topical BID   Continuous Infusions: . 0.9 % NaCl with KCl 20 mEq / L 50 mL/hr at 03/18/19 0419  . methocarbamol (ROBAXIN) IV    . ondansetron (ZOFRAN) IV       LOS: 7 days   Time spent= 15 mins    Bonnell Public, MD [facility]  If 7PM-7AM, please contact night-coverage  03/18/2019, 12:31 PM

## 2019-03-18 NOTE — TOC Progression Note (Signed)
Transition of Care [facility]) - Progression Note    Patient Details  Name: Christine Kramer MRN: [id number] Date of Birth: January 31, 1921  [facility] CM/SW Contact  Leeroy Cha, RN Phone Number: 03/18/2019, 12:11 PM  Clinical Narrative:    tct-to relative mark-message left for the need of a copy of the correct insurance care through [employer].  Per Amy at [facility] the one in the system is not correct according to [facility].   Expected Discharge Plan: [place] Barriers to Discharge: Continued Medical Work up  Expected Discharge Plan and Services Expected Discharge Plan: [place]   Discharge Planning Services: CM Consult Post Acute Care Choice: [place] Living arrangements for the past 2 months: Single Family Home                 DME Arranged: N/A DME Agency: NA       HH Arranged: NA HH Agency: NA         Social Determinants of Health (SDOH) Interventions    Readmission Risk Interventions No flowsheet data found.

## 2019-03-18 NOTE — TOC Progression Note (Addendum)
Transition of Care [facility]) - Progression Note    Patient Details  Name: Christine Kramer MRN: [id number] Date of Birth: [birth year]  Transition of Care [facility]) CM/SW Contact  Leeroy Cha, RN Phone Number: 03/18/2019, 10:43 AM  Clinical Narrative:    snf accepted at \ [place]  See where patient prefers and who has beds. [facility] bed starting on [id number] patient will need new covid test.  Message to md [place]. tct-[employer] medicare not managed through this area will have to go through regular bcbs./tct-Kathy Campbell/information given/  Expected Discharge Plan: [place] Barriers to Discharge: Continued Medical Work up  Expected Discharge Plan and Services Expected Discharge Plan: [place]   Discharge Planning Services: CM Consult Post Acute Care Choice: [place] Living arrangements for the past 2 months: Single Family Home                 DME Arranged: N/A DME Agency: NA       HH Arranged: NA HH Agency: NA         Social Determinants of Health (SDOH) Interventions    Readmission Risk Interventions No flowsheet data found.

## 2019-03-19 LAB — SARS CORONAVIRUS 2 BY RT PCR (HOSPITAL ORDER, PERFORMED IN ~~LOC~~ HOSPITAL LAB): SARS Coronavirus 2: NEGATIVE

## 2019-03-19 MED ORDER — HYDROCORTISONE (PERIANAL) 2.5 % EX CREA: 1.0000 "application " | TOPICAL_CREAM | Freq: Four times a day (QID) | CUTANEOUS | 0 refills | Status: AC | PRN

## 2019-03-19 MED ORDER — BISACODYL 10 MG RE SUPP: 10.0000 mg | Freq: Every day | RECTAL | 0 refills | Status: AC

## 2019-03-19 MED ORDER — SIMETHICONE 40 MG/0.6ML PO SUSP: 40.0000 mg | Freq: Four times a day (QID) | ORAL | 0 refills | Status: AC | PRN

## 2019-03-19 MED ORDER — ONDANSETRON HCL 4 MG PO TABS: 4.0000 mg | ORAL_TABLET | Freq: Four times a day (QID) | ORAL | 0 refills | Status: AC | PRN

## 2019-03-19 MED ORDER — HYDROCORTISONE 1 % EX CREA: 1.0000 "application " | TOPICAL_CREAM | Freq: Three times a day (TID) | CUTANEOUS | 0 refills | Status: AC | PRN

## 2019-03-19 MED ORDER — ACETAMINOPHEN 650 MG RE SUPP: 650.0000 mg | Freq: Four times a day (QID) | RECTAL | 0 refills | Status: AC | PRN

## 2019-03-19 MED ORDER — ACETAMINOPHEN 325 MG PO TABS: 650.0000 mg | ORAL_TABLET | Freq: Four times a day (QID) | ORAL | Status: AC | PRN

## 2019-03-19 MED ORDER — ALUM & MAG HYDROXIDE-SIMETH 200-200-20 MG/5ML PO SUSP: 30.0000 mL | Freq: Four times a day (QID) | ORAL | 0 refills | Status: AC | PRN

## 2019-03-19 NOTE — Discharge Summary (Addendum)
Physician Discharge Summary  Christine Kramer [id number] DOB: Aug 18, 1920 DOA: 03/10/2019  PCP: System, Pcp Not In  Admit date: 03/10/2019 Discharge date: 03/20/2019 Admit from home Disposition: Home  Recommendations for Outpatient Follow-up:  1. Follow up with PCP in 1-2 weeks 2. Please obtain BMP/CBC in one week 3. Please follow up on the following pending results:  Home Health: None Equipment/Devices: None  Discharge Condition: Stable Diet recommendation: Soft diet advance as tolerated  Brief/Interim Summary:85 year old with past medical history of HTN, HLD, GERD, dementia came to the hospital with acute abdominal pain.  CT was consistent with small bowel obstruction with transition point in the low mid abdomen, 1.5 cm cystic mass in the pancreatic tail.  Past surgical history of hysterectomy.  NG tube placed, small bowel protocol.  Patient symptoms have resolved.  NG tube has been discontinued.  General surgery assisted in directing patient's care.  Diet has been advanced.  Patient is stable for discharge.  Repeat COVID-19 test negative. Patient will be discharged to skilled nursing facility.  Discharge Diagnoses:  Principal Problem:   SBO (small bowel obstruction) (HCC) Active Problems:   Dementia ([facility])   Essential hypertension   OAB (overactive bladder)   Cystic mass of tail pancreas   Hypertension   #1 small bowel obstruction with history of constipation patient admitted with the same diagnosis seen by general surgery.  Treated conservatively.  NG tube was placed.  NG tube removed 03/14/2019 patient tolerating diet moving bowels and having flatus.  She will be discharged to a skilled nursing facility per PT recommendation.  #2 hypertension continue Norvasc  #3 dementia stable  #4 hypokalemia has been resolved.  Please check her CBC magnesium and BMP in 1 week.   Estimated body mass index is 17.2 kg/m as calculated from the following:   Height as of this encounter: 5'  7" (1.702 m).   Weight as of this encounter: 49.8 kg.  Discharge Instructions  Discharge Instructions    Call MD for:  difficulty breathing, headache or visual disturbances   Complete by: As directed    Call MD for:  persistant nausea and vomiting   Complete by: As directed    Call MD for:  severe uncontrolled pain   Complete by: As directed    Call MD for:  temperature >100.4   Complete by: As directed    Diet - low sodium heart healthy   Complete by: As directed    Increase activity slowly   Complete by: As directed      Allergies as of 03/19/2019   No Known Allergies     Medication List    TAKE these medications   acetaminophen 325 MG tablet Commonly known as: TYLENOL Take 2 tablets (650 mg total) by mouth every 6 (six) hours as needed for mild pain (or Fever >/= 101).   acetaminophen 650 MG suppository Commonly known as: TYLENOL Place 1 suppository (650 mg total) rectally every 6 (six) hours as needed for mild pain (or Fever >/= 101).   alum & mag hydroxide-simeth 200-200-20 MG/5ML suspension Commonly known as: MAALOX/MYLANTA Take 30 mLs by mouth every 6 (six) hours as needed for indigestion, heartburn or flatulence.   amLODipine 5 MG tablet Commonly known as: NORVASC TAKE 1 TABLET BY MOUTH DAILY   aspirin 81 MG chewable tablet Chew 81 mg by mouth daily.   bisacodyl 10 MG suppository Commonly known as: DULCOLAX Place 1 suppository (10 mg total) rectally daily.   hydrocortisone 2.5 % rectal cream  Commonly known as: ANUSOL-HC Apply 1 application topically 4 (four) times daily as needed for hemorrhoids.   hydrocortisone cream 1 % Apply 1 application topically 3 (three) times daily as needed for itching (minor skin irritation).   latanoprost 0.005 % ophthalmic solution Commonly known as: XALATAN Place 1 drop into both eyes at bedtime.   ondansetron 4 MG tablet Commonly known as: ZOFRAN Take 1 tablet (4 mg total) by mouth every 6 (six) hours as needed for  nausea.   simethicone 40 MG/0.6ML drops Commonly known as: MYLICON Take 0.6 mLs (40 mg total) by mouth 4 (four) times daily as needed for flatulence (PRN bloating, gassiness).       No Known Allergies  Consultations: General surgery  Procedures/Studies: Ct Abdomen Pelvis Wo Contrast  Result Date: 03/11/2019 CLINICAL DATA:  Nausea and vomiting EXAM: CT ABDOMEN AND PELVIS WITHOUT CONTRAST TECHNIQUE: Multidetector CT imaging of the abdomen and pelvis was performed following the standard protocol without IV contrast. COMPARISON:  None. FINDINGS: Lower chest: There is a trace right-sided pleural effusion.There are advanced mitral valve calcifications and coronary artery calcifications. Hepatobiliary: The liver is normal. Normal gallbladder.There is no biliary ductal dilation. Pancreas: There is a 1.5 x 1.5 cm cystic appearing structure that appears to be arising from the pancreatic tail. Spleen: No splenic laceration or hematoma. Adrenals/Urinary Tract: --Adrenal glands: No adrenal hemorrhage. --Right kidney/ureter: No hydronephrosis or perinephric hematoma. --Left kidney/ureter: There is a nonobstructing stone in the interpolar region of the left kidney. --Urinary bladder: The urinary bladder is distended. Stomach/Bowel: --Stomach/Duodenum: No hiatal hernia or other gastric abnormality. Normal duodenal course and caliber. --Small bowel: There are dilated loops of small bowel measuring up to approximately 4 cm with air-fluid levels. There appears to be a transition point in the low midline abdomen (axial series 3, images 48 through 52). --Colon: Rectosigmoid diverticulosis without acute inflammation. --Appendix: Normal. Vascular/Lymphatic: Atherosclerotic calcification is present within the non-aneurysmal abdominal aorta, without hemodynamically significant stenosis. --No retroperitoneal lymphadenopathy. --No mesenteric lymphadenopathy. --No pelvic or inguinal lymphadenopathy. Reproductive: Unremarkable  Other: No ascites or free air. The abdominal wall is normal. Musculoskeletal. No acute displaced fractures. IMPRESSION: 1. Small bowel obstruction with transition point in the low midline abdomen. 2. Trace right-sided pleural effusion. 3. A 1.5 cm cystic mass that appears to be arising from the pancreatic tail. 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 Aortic Atherosclerosis (ICD10-I70.0). Electronically Signed   By: Constance Holster M.D.   On: 03/11/2019 00:09   Dg Abd 1 View  Result Date: 03/12/2019 CLINICAL DATA:  Small-bowel obstruction, abdominal distension EXAM: ABDOMEN - 1 VIEW COMPARISON:  03/11/2019 FINDINGS: Retained contrast at gastric fundus. Scattered gas and contrast throughout persistently dilated small bowel loops consistent with high-grade small bowel obstruction. No significant colonic contrast. No definite bowel wall thickening. Bones demineralized. IMPRESSION: Persistent high-grade small bowel obstruction. Electronically Signed   By: Lavonia Dana M.D.   On: 03/12/2019 07:25   Dg Chest Port 1 View  Result Date: 03/16/2019 CLINICAL DATA:  Wheezing EXAM: PORTABLE CHEST 1 VIEW COMPARISON:  None. FINDINGS: The heart size and mediastinal contours are within normal limits. Both lungs are clear. The visualized skeletal structures are unremarkable. IMPRESSION: No active disease. Electronically Signed   By: Ulyses Jarred M.D.   On: 03/16/2019 03:29   Dg Abd Portable 1v-small Bowel Obstruction Protocol-24 Hr Delay  Result Date: 03/14/2019 CLINICAL DATA:  Small-bowel obstruction. EXAM: PORTABLE ABDOMEN - 1 VIEW COMPARISON:  03/12/2019 FINDINGS: Persistent mildly dilated air-filled loops  of small bowel. There is also scattered air in the colon. The contrast seen in the colon on the prior study has been evacuated. No free air. IMPRESSION: Persistent mildly dilated loops of small bowel in the central abdomen but contrast passed all the way from the stomach through the colon.  This could be partial small bowel obstruction. Electronically Signed   By: Marijo Sanes M.D.   On: 03/14/2019 05:41   Dg Abd Portable 1v-small Bowel Obstruction Protocol-initial, 8 Hr Delay  Result Date: 03/12/2019 CLINICAL DATA:  Small bowel protocol 8 hour delay EXAM: PORTABLE ABDOMEN - 1 VIEW COMPARISON:  CT 03/10/2019, radiograph 03/12/2019, 03/11/2019 FINDINGS: Esophageal tube is folded back upon itself in the stomach with the tip position beneath the left hemidiaphragm. Radiopaque contrast material is present within the colon. There remains however significantly dilated central small bowel up to 5.2 cm. IMPRESSION: 1. Radiopaque contrast material visible within the colon 2. Persistent prominent gaseous dilatation of central small bowel consistent with obstruction 3. Esophageal tube is folded back upon itself in the stomach with the tip positioned in the left upper quadrant Electronically Signed   By: Donavan Foil M.D.   On: 03/12/2019 21:58   Dg Abd Portable 1v  Result Date: 03/12/2019 CLINICAL DATA:  Small bowel obstruction. NG tube placement. EXAM: PORTABLE ABDOMEN - 1 VIEW 10:58 a.m. COMPARISON:  Comparison 03/11/2019 at 8:59 p.m. FINDINGS: NG tube is been inserted. The tip is in the fundus of the stomach. Multiple distended small bowel loops are noted in the lower abdomen. IMPRESSION: 1. The tip of the nasogastric tube is in the fundus of the stomach. 2. Multiple distended small bowel loops. Electronically Signed   By: Lorriane Shire M.D.   On: 03/12/2019 11:18   Dg Abd Portable 1v-small Bowel Obstruction Protocol-initial, 8 Hr Delay  Result Date: 03/11/2019 CLINICAL DATA:  Follow-up small-bowel protocol EXAM: PORTABLE ABDOMEN - 1 VIEW COMPARISON:  03/10/2019 FINDINGS: Contrast material is noted within the stomach. No significant passage of contrast is noted distally. Multiple dilated small bowel loops are noted. No free air is seen. Continued follow-up is recommended. IMPRESSION: Stable  appearance of small bowel dilatation. No significant passage of contrast material is noted distally. Electronically Signed   By: Inez Catalina M.D.   On: 03/11/2019 21:25    (Echo, Carotid, EGD, Colonoscopy, ERCP)    Subjective:  Patient resting in bed in no acute distress she ate some of her grits or oats reports she does not have appetite or not hungry now Discharge Exam: Vitals:   03/18/19 2030 03/19/19 0507  BP: (!) 154/61 134/70  Pulse: 73 76  Resp: 20 16  Temp: 98.1 F (36.7 C) 97.7 F (36.5 C)  SpO2: 100% 98%   Vitals:   03/18/19 0421 03/18/19 1310 03/18/19 2030 03/19/19 0507  BP: (!) 158/70 (!) 147/68 (!) 154/61 134/70  Pulse: 75 66 73 76  Resp: 12 14 20 16   Temp: 97.8 F (36.6 C) 97.9 F (36.6 C) 98.1 F (36.7 C) 97.7 F (36.5 C)  TempSrc: Oral Oral Oral Oral  SpO2: 100% 100% 100% 98%  Weight:      Height:        General: Pt is alert, awake, not in acute distress Cardiovascular: RRR, S1/S2 +, no rubs, no gallops Respiratory: CTA bilaterally, no wheezing, no rhonchi Abdominal: Soft, NT, ND, bowel sounds + Extremities: no edema, no cyanosis    The results of significant diagnostics from this hospitalization (including imaging, microbiology, ancillary and  laboratory) are listed below for reference.     Microbiology: Recent Results (from the past 240 hour(s))  SARS CORONAVIRUS 2 (TAT 6-24 HRS) Nasopharyngeal Nasopharyngeal Swab     Status: None   Collection Time: 03/11/19  1:41 AM   Specimen: Nasopharyngeal Swab  Result Value Ref Range Status   SARS Coronavirus 2 NEGATIVE NEGATIVE Final    Comment: (NOTE) SARS-CoV-2 target nucleic acids are NOT DETECTED. The SARS-CoV-2 RNA is generally detectable in upper and lower respiratory specimens during the acute phase of infection. Negative results do not preclude SARS-CoV-2 infection, do not rule out co-infections with other pathogens, and should not be used as the sole basis for treatment or other patient  management decisions. Negative results must be combined with clinical observations, patient history, and epidemiological information. The expected result is Negative. Fact Sheet for Patients: SugarRoll.be Fact Sheet for Healthcare Providers: https://www.woods-mathews.com/ This test is not yet approved or cleared by the [place] FDA and  has been authorized for detection and/or diagnosis of SARS-CoV-2 by FDA under an Emergency Use Authorization (EUA). This EUA will remain  in effect (meaning this test can be used) for the duration of the COVID-19 declaration under Section 56 4(b)(1) of the Act, 21 U.S.C. section 360bbb-3(b)(1), unless the authorization is terminated or revoked sooner. Performed at [facility], [place] [address] [place], [place] [address]   SARS CORONAVIRUS 2 (TAT 6-24 HRS) Nasopharyngeal Nasopharyngeal Swab     Status: None   Collection Time: 03/18/19 11:06 AM   Specimen: Nasopharyngeal Swab  Result Value Ref Range Status   SARS Coronavirus 2 NEGATIVE NEGATIVE Final    Comment: (NOTE) SARS-CoV-2 target nucleic acids are NOT DETECTED. The SARS-CoV-2 RNA is generally detectable in upper and lower respiratory specimens during the acute phase of infection. Negative results do not preclude SARS-CoV-2 infection, do not rule out co-infections with other pathogens, and should not be used as the sole basis for treatment or other patient management decisions. Negative results must be combined with clinical observations, patient history, and epidemiological information. The expected result is Negative. Fact Sheet for Patients: SugarRoll.be Fact Sheet for Healthcare Providers: https://www.woods-mathews.com/ This test is not yet approved or cleared by the [place] FDA and  has been authorized for detection and/or diagnosis of SARS-CoV-2 by FDA under an Emergency Use Authorization  (EUA). This EUA will remain  in effect (meaning this test can be used) for the duration of the COVID-19 declaration under Section 56 4(b)(1) of the Act, 21 U.S.C. section 360bbb-3(b)(1), unless the authorization is terminated or revoked sooner. Performed at [facility], [place] [address] [place], [place] [address]      Labs: BNP (last 3 results) No results for input(s): BNP in the last 8760 hours. Basic Metabolic Panel: Recent Labs  Lab 03/14/19 1010 03/15/19 0329 03/17/19 0606  NA 139 136 138  K 3.1* 3.7 3.8  CL 106 105 106  CO2 25 21* 23  GLUCOSE 115* 98 94  BUN 11 10 10   CREATININE 0.52 0.44 0.50  CALCIUM 8.5* 8.8* 8.7*  MG  --  1.9 1.6*  PHOS  --  1.8* 3.4   Liver Function Tests: Recent Labs  Lab 03/15/19 0329 03/17/19 0606  ALBUMIN 2.9* 2.9*   No results for input(s): LIPASE, AMYLASE in the last 168 hours. No results for input(s): AMMONIA in the last 168 hours. CBC: Recent Labs  Lab 03/17/19 0606  WBC 7.4  NEUTROABS 4.3  HGB 11.4*  HCT 34.2*  MCV 84.4  PLT 153   Cardiac Enzymes: No results for input(s): CKTOTAL, CKMB, CKMBINDEX, TROPONINI in the last 168 hours. BNP: Invalid input(s): POCBNP CBG: No results for input(s): GLUCAP in the last 168 hours. D-Dimer No results for input(s): DDIMER in the last 72 hours. Hgb A1c No results for input(s): HGBA1C in the last 72 hours. Lipid Profile No results for input(s): CHOL, HDL, LDLCALC, TRIG, CHOLHDL, LDLDIRECT in the last 72 hours. Thyroid function studies No results for input(s): TSH, T4TOTAL, T3FREE, THYROIDAB in the last 72 hours.  Invalid input(s): FREET3 Anemia work up No results for input(s): VITAMINB12, FOLATE, FERRITIN, TIBC, IRON, RETICCTPCT in the last 72 hours. Urinalysis    Component Value Date/Time   COLORURINE YELLOW 04/17/2016 1329   APPEARANCEUR CLEAR 04/17/2016 1329   LABSPEC 1.007 04/17/2016 1329   PHURINE 7.5 04/17/2016 1329   GLUCOSEU NEGATIVE 04/17/2016 1329   HGBUR  NEGATIVE 04/17/2016 1329   BILIRUBINUR NEGATIVE 04/17/2016 1329   KETONESUR NEGATIVE 04/17/2016 1329   PROTEINUR NEGATIVE 04/17/2016 1329   UROBILINOGEN 0.2 04/18/2007 1442   NITRITE NEGATIVE 04/17/2016 1329   LEUKOCYTESUR TRACE (A) 04/17/2016 1329   Sepsis Labs Invalid input(s): PROCALCITONIN,  WBC,  LACTICIDVEN Microbiology Recent Results (from the past 240 hour(s))  SARS CORONAVIRUS 2 (TAT 6-24 HRS) Nasopharyngeal Nasopharyngeal Swab     Status: None   Collection Time: 03/11/19  1:41 AM   Specimen: Nasopharyngeal Swab  Result Value Ref Range Status   SARS Coronavirus 2 NEGATIVE NEGATIVE Final    Comment: (NOTE) SARS-CoV-2 target nucleic acids are NOT DETECTED. The SARS-CoV-2 RNA is generally detectable in upper and lower respiratory specimens during the acute phase of infection. Negative results do not preclude SARS-CoV-2 infection, do not rule out co-infections with other pathogens, and should not be used as the sole basis for treatment or other patient management decisions. Negative results must be combined with clinical observations, patient history, and epidemiological information. The expected result is Negative. Fact Sheet for Patients: SugarRoll.be Fact Sheet for Healthcare Providers: https://www.woods-mathews.com/ This test is not yet approved or cleared by the [place] FDA and  has been authorized for detection and/or diagnosis of SARS-CoV-2 by FDA under an Emergency Use Authorization (EUA). This EUA will remain  in effect (meaning this test can be used) for the duration of the COVID-19 declaration under Section 56 4(b)(1) of the Act, 21 U.S.C. section 360bbb-3(b)(1), unless the authorization is terminated or revoked sooner. Performed at [facility], [place] [address], [place], [place] [address]   SARS CORONAVIRUS 2 (TAT 6-24 HRS) Nasopharyngeal Nasopharyngeal Swab     Status: None   Collection Time: 03/18/19  11:06 AM   Specimen: Nasopharyngeal Swab  Result Value Ref Range Status   SARS Coronavirus 2 NEGATIVE NEGATIVE Final    Comment: (NOTE) SARS-CoV-2 target nucleic acids are NOT DETECTED. The SARS-CoV-2 RNA is generally detectable in upper and lower respiratory specimens during the acute phase of infection. Negative results do not preclude SARS-CoV-2 infection, do not rule out co-infections with other pathogens, and should not be used as the sole basis for treatment or other patient management decisions. Negative results must be combined with clinical observations, patient history, and epidemiological information. The expected result is Negative. Fact Sheet for Patients: SugarRoll.be Fact Sheet for Healthcare Providers: https://www.woods-mathews.com/ This test is not yet approved or cleared by the [place] FDA and  has been authorized for detection and/or diagnosis of SARS-CoV-2 by FDA under an Emergency Use Authorization (EUA). This EUA  will remain  in effect (meaning this test can be used) for the duration of the COVID-19 declaration under Section 56 4(b)(1) of the Act, 21 U.S.C. section 360bbb-3(b)(1), unless the authorization is terminated or revoked sooner. Performed at [facility], [place] [address] [place], [place] [address]      Time coordinating discharge: 39 minutes  SIGNED:   Georgette Shell, MD  [facility] 03/19/2019, 9:57 AM Pager   If 7PM-7AM, please contact night-coverage [email or web address] Password [email or web address]

## 2019-03-19 NOTE — TOC Benefit Eligibility Note (Signed)
Transition of Care [facility]) Benefit Eligibility Note    Patient Details  Name: Christine Kramer MRN: [id number] Date of Birth: 1920-06-21      Covered?: Yes(SNF is covered 12 months no deductable Co Insurance covered at 100% days 1-20 and 21-50 $100.00 patient is responsible for $100.00 up to $4000.00 then covered at 100% per calender year)        Spoke with Person/Company/Phone Number:: Tressia Miners PA [phone] call Ref# [id number]     Prior Approval: Yes(prior auth [id number] opt. 7)          [name] Phone Number: 03/19/2019, 9:34 AM

## 2019-03-19 NOTE — TOC Progression Note (Addendum)
Transition of Care [facility]) - Progression Note    Patient Details  Name: Christine Kramer MRN: [id number] Date of Birth: 1920-06-26  [facility]) CM/SW Contact  Leeroy Cha, RN Phone Number: 03/19/2019, 2:07 PM  Clinical Narrative:    Awaiting insurance authorization. For snf placement. [facility] as to where authorization process is.  [facility]  Expected Discharge Plan: Skilled Nursing Facility Barriers to Discharge: Insurance Authorization  Expected Discharge Plan and Services Expected Discharge Plan: [place]   Discharge Planning Services: CM Consult Post Acute Care Choice: [place] Living arrangements for the past 2 months: Single Family Home Expected Discharge Date: 03/19/19               DME Arranged: N/A DME Agency: NA       HH Arranged: NA HH Agency: NA         Social Determinants of Health (SDOH) Interventions    Readmission Risk Interventions No flowsheet data found.

## 2019-03-20 NOTE — Care Management Important Message (Signed)
Important Message  Patient Details IM Letter given to Velva Harman RN to present to the Patient Name: Christine Kramer MRN: [id number] Date of Birth: 1920-10-03   Medicare Important Message Given:  Yes     [name] 03/20/2019, 12:42 PM

## 2019-03-21 NOTE — Progress Notes (Signed)
Patient d/c to [facility] and transported/packet given by PTAR. All questions were answered.

## 2019-03-21 NOTE — Progress Notes (Signed)
PROGRESS NOTE    Christine Kramer  [id number] DOB: 1921-04-18 DOA: 03/10/2019 PCP: System, Pcp Not In    Brief Narrative:85 year old with past medical history of HTN, HLD, GERD, dementia came to the hospital with acute abdominal pain. CT was consistent with small bowel obstruction with transition point in the low mid abdomen, 1.5 cm cystic mass in the pancreatic tail. Past surgical history of hysterectomy. NG tube placed, small bowel protocol. Patient symptoms have resolved. NG tube has been discontinued. General surgeryassisted in directing patient's care. Diet has been advanced. Patient is stable for discharge. Repeat COVID-19 test negative.Patient will be discharged to skilled nursing facility Assessment & Plan:   Principal Problem:   SBO (small bowel obstruction) (HCC) Active Problems:   Dementia ([place])   Essential hypertension   OAB (overactive bladder)   Cystic mass of tail pancreas   Hypertension   #1 small bowel obstruction with history of constipation patient admitted with the same diagnosis seen by general surgery.  Treated conservatively.  NG tube was placed.  NG tube removed 03/14/2019 patient tolerating diet moving bowels and having flatus.  She will be discharged to a skilled nursing facility per PT recommendation.  #2 hypertension continue Norvasc blood pressure 128/68  #3 dementia stable  #4 hypokalemia has been resolved.    Estimated body mass index is 17.2 kg/m as calculated from the following:   Height as of this encounter: 5\' 7"  (1.702 m).   Weight as of this encounter: 49.8 kg.  DVT prophylaxis: Lovenox CODE STATUS full code Family Communication: None Disposition Plan: Patient is unsafe discharge home seen by physical therapy recommends SNF waiting on authorization  Consultants: General surgery  Procedures: None Antimicrobials: None Subjective:  Resting in bed overnight staff has no concerns tolerating diet having flatus and bowel  movements Objective: Vitals:   03/20/19 0436 03/20/19 1320 03/20/19 2249 03/21/19 0641  BP: 131/64 119/75 (!) 157/81 128/68  Pulse: 75 85 84 68  Resp: 16 16 18 18   Temp: 98.1 F (36.7 C) 98.9 F (37.2 C) 98.7 F (37.1 C) 98.8 F (37.1 C)  TempSrc: Oral Oral Oral Oral  SpO2: 99% 96% 95% 100%  Weight:      Height:        Intake/Output Summary (Last 24 hours) at 03/21/2019 1214 Last data filed at 03/21/2019 1004 Gross per 24 hour  Intake 1611.84 ml  Output 1600 ml  Net 11.84 ml   Filed Weights   03/13/19 0300  Weight: 49.8 kg    Examination:  General exam: Appears calm and comfortable  Respiratory system: Clear to auscultation. Respiratory effort normal. Cardiovascular system: S1 & S2 heard, RRR. No JVD, murmurs, rubs, gallops or clicks. No pedal edema. Gastrointestinal system: Abdomen is nondistended, soft and nontender. No organomegaly or masses felt. Normal bowel sounds heard. Central nervous system: Alert and oriented. No focal neurological deficits. Extremities: Symmetric 5 x 5 power. Skin: No rashes, lesions or ulcers Psychiatry: Judgement and insight appear normal. Mood & affect appropriate.     Data Reviewed: I have personally reviewed following labs and imaging studies  CBC: Recent Labs  Lab 03/17/19 0606  WBC 7.4  NEUTROABS 4.3  HGB 11.4*  HCT 34.2*  MCV 84.4  PLT 0000000   Basic Metabolic Panel: Recent Labs  Lab 03/15/19 0329 03/17/19 0606  NA 136 138  K 3.7 3.8  CL 105 106  CO2 21* 23  GLUCOSE 98 94  BUN 10 10  CREATININE 0.44 0.50  CALCIUM 8.8* 8.7*  MG 1.9 1.6*  PHOS 1.8* 3.4   GFR: Estimated Creatinine Clearance: 32.3 mL/min (by C-G formula based on SCr of 0.5 mg/dL). Liver Function Tests: Recent Labs  Lab 03/15/19 0329 03/17/19 0606  ALBUMIN 2.9* 2.9*   No results for input(s): LIPASE, AMYLASE in the last 168 hours. No results for input(s): AMMONIA in the last 168 hours. Coagulation Profile: No results for input(s): INR,  PROTIME in the last 168 hours. Cardiac Enzymes: No results for input(s): CKTOTAL, CKMB, CKMBINDEX, TROPONINI in the last 168 hours. BNP (last 3 results) No results for input(s): PROBNP in the last 8760 hours. HbA1C: No results for input(s): HGBA1C in the last 72 hours. CBG: No results for input(s): GLUCAP in the last 168 hours. Lipid Profile: No results for input(s): CHOL, HDL, LDLCALC, TRIG, CHOLHDL, LDLDIRECT in the last 72 hours. Thyroid Function Tests: No results for input(s): TSH, T4TOTAL, FREET4, T3FREE, THYROIDAB in the last 72 hours. Anemia Panel: No results for input(s): VITAMINB12, FOLATE, FERRITIN, TIBC, IRON, RETICCTPCT in the last 72 hours. Sepsis Labs: No results for input(s): PROCALCITON, LATICACIDVEN in the last 168 hours.  Recent Results (from the past 240 hour(s))  SARS CORONAVIRUS 2 (TAT 6-24 HRS) Nasopharyngeal Nasopharyngeal Swab     Status: None   Collection Time: 03/18/19 11:06 AM   Specimen: Nasopharyngeal Swab  Result Value Ref Range Status   SARS Coronavirus 2 NEGATIVE NEGATIVE Final    Comment: (NOTE) SARS-CoV-2 target nucleic acids are NOT DETECTED. The SARS-CoV-2 RNA is generally detectable in upper and lower respiratory specimens during the acute phase of infection. Negative results do not preclude SARS-CoV-2 infection, do not rule out co-infections with other pathogens, and should not be used as the sole basis for treatment or other patient management decisions. Negative results must be combined with clinical observations, patient history, and epidemiological information. The expected result is Negative. Fact Sheet for Patients: SugarRoll.be Fact Sheet for Healthcare Providers: https://www.woods-.com/ This test is not yet approved or cleared by the [place] FDA and  has been authorized for detection and/or diagnosis of SARS-CoV-2 by FDA under an Emergency Use Authorization (EUA). This EUA will  remain  in effect (meaning this test can be used) for the duration of the COVID-19 declaration under Section 56 4(b)(1) of the Act, 21 U.S.C. section 360bbb-3(b)(1), unless the authorization is terminated or revoked sooner. Performed at [facility], [place] [address], [place], [place] [address]   SARS Coronavirus 2 by RT PCR (hospital order, performed in [facility] hospital lab) Nasopharyngeal Nasopharyngeal Swab     Status: None   Collection Time: 03/19/19 12:26 PM   Specimen: Nasopharyngeal Swab  Result Value Ref Range Status   SARS Coronavirus 2 NEGATIVE NEGATIVE Final    Comment: (NOTE) If result is NEGATIVE SARS-CoV-2 target nucleic acids are NOT DETECTED. The SARS-CoV-2 RNA is generally detectable in upper and lower  respiratory specimens during the acute phase of infection. The lowest  concentration of SARS-CoV-2 viral copies this assay can detect is 250  copies / mL. A negative result does not preclude SARS-CoV-2 infection  and should not be used as the sole basis for treatment or other  patient management decisions.  A negative result may occur with  improper specimen collection / handling, submission of specimen other  than nasopharyngeal swab, presence of viral mutation(s) within the  areas targeted by this assay, and inadequate number of viral copies  (<250 copies / mL). A negative result must be combined with clinical  observations,  patient history, and epidemiological information. If result is POSITIVE SARS-CoV-2 target nucleic acids are DETECTED. The SARS-CoV-2 RNA is generally detectable in upper and lower  respiratory specimens dur ing the acute phase of infection.  Positive  results are indicative of active infection with SARS-CoV-2.  Clinical  correlation with patient history and other diagnostic information is  necessary to determine patient infection status.  Positive results do  not rule out bacterial infection or co-infection with other viruses. If  result is PRESUMPTIVE POSTIVE SARS-CoV-2 nucleic acids MAY BE PRESENT.   A presumptive positive result was obtained on the submitted specimen  and confirmed on repeat testing.  While 2019 novel coronavirus  (SARS-CoV-2) nucleic acids may be present in the submitted sample  additional confirmatory testing may be necessary for epidemiological  and / or clinical management purposes  to differentiate between  SARS-CoV-2 and other Sarbecovirus currently known to infect humans.  If clinically indicated additional testing with an alternate test  methodology [phone]) is advised. The SARS-CoV-2 RNA is generally  detectable in upper and lower respiratory sp ecimens during the acute  phase of infection. The expected result is Negative. Fact Sheet for Patients:  StrictlyIdeas.no Fact Sheet for Healthcare Providers: BankingDealers.co.za This test is not yet approved or cleared by the [place] FDA and has been authorized for detection and/or diagnosis of SARS-CoV-2 by FDA under an Emergency Use Authorization (EUA).  This EUA will remain in effect (meaning this test can be used) for the duration of the COVID-19 declaration under Section 564(b)(1) of the Act, 21 U.S.C. section 360bbb-3(b)(1), unless the authorization is terminated or revoked sooner. Performed at [facility], [place] [address] [place], [place] [address]          Radiology Studies: No results found.      Scheduled Meds: . amLODipine  5 mg Oral Daily  . aspirin  81 mg Oral Daily  . bisacodyl  10 mg Rectal Daily  . enoxaparin (LOVENOX) injection  40 mg Subcutaneous Daily  . latanoprost  1 drop Both Eyes QHS  . lip balm  1 application Topical BID   Continuous Infusions: . 0.9 % NaCl with KCl 20 mEq / L 50 mL/hr at 03/20/19 2117  . methocarbamol (ROBAXIN) IV    . ondansetron (ZOFRAN) IV       LOS: 10 days     Georgette Shell, MD [facility] If 7PM-7AM, please contact night-coverage [email or web address] Password [email or web address] 03/21/2019, 12:14 PM

## 2019-03-21 NOTE — Progress Notes (Signed)
Physical Therapy Treatment Patient Details Name: Christine Kramer MRN: [id number] DOB: 11-17-1920 Today's Date: 03/21/2019    History of Present Illness 85 year old with past medical history of HTN, HLD, GERD, dementia came to the hospital with acute abdominal pain and diagnosed with SBO.    PT Comments    Pt is slowly progressing towards mobility goals. Pt is limited by fear of falling. Pt repeatedly reported she didn't not want to fall. Pt leans back heavily at times and walks feet forward creating more posterior lean. Focused session on building confidence and weight shifting over base of support. Pt will need continued skilled PT to maximize mobility for next venue of care.   Follow Up Recommendations  SNF     Equipment Recommendations  None recommended by PT    Recommendations for Other Services       Precautions / Restrictions Precautions Precautions: Fall Restrictions Weight Bearing Restrictions: No    Mobility  Bed Mobility               General bed mobility comments: OOB with NT  Transfers Overall transfer level: Needs assistance Equipment used: Rolling walker (2 wheeled)   Sit to Stand: Mod assist         General transfer comment: Pt very fearful of falling, pt leans heavily back and does not like to get BOS over feet. Focused session on scooting to edge of chair and leaning over with trunk to assist with rise up. Pt sit to stand with RW mod assist, sit to stand without RW max assist for balamce. anticipate pt may do well with an eva support walker to build up trust and confidence to stand more upright.  Ambulation/Gait             General Gait Details: Pt refused to ambulate   Stairs             Wheelchair Mobility    Modified Rankin (Stroke Patients Only)       Balance Overall balance assessment: Needs assistance Sitting-balance support: Bilateral upper extremity supported Sitting balance-Leahy Scale: Fair     Standing balance  support: Bilateral upper extremity supported Standing balance-Leahy Scale: Poor                              Cognition Arousal/Alertness: Awake/alert Behavior During Therapy: WFL for tasks assessed/performed Overall Cognitive Status: History of cognitive impairments - at baseline                                        Exercises General Exercises - Lower Extremity Ankle Circles/Pumps: AROM;Both;10 reps;Seated Long Arc Quad: AROM;Strengthening;Both;10 reps;Seated Heel Slides: AROM;Strengthening;Both;10 reps;Seated Hip ABduction/ADduction: AROM;Strengthening;Both;10 reps;Seated Straight Leg Raises: AROM;Strengthening;Both;10 reps;Seated    General Comments General comments (skin integrity, edema, etc.): worked on upright standing tolerance with and without RW      Pertinent Vitals/Pain Pain Assessment: No/denies pain    Home Living                      Prior Function            PT Goals (current goals can now be found in the care plan section) Progress towards PT goals: Progressing toward goals    Frequency    Min 2X/week      PT Plan Current plan remains appropriate  Co-evaluation              AM-PAC PT "6 Clicks" Mobility   Outcome Measure  Help needed turning from your back to your side while in a flat bed without using bedrails?: A Little Help needed moving from lying on your back to sitting on the side of a flat bed without using bedrails?: A Lot Help needed moving to and from a bed to a chair (including a wheelchair)?: A Lot Help needed standing up from a chair using your arms (e.g., wheelchair or bedside chair)?: A Lot Help needed to walk in hospital room?: A Lot Help needed climbing 3-5 steps with a railing? : Total 6 Click Score: 12    End of Session Equipment Utilized During Treatment: Gait belt Activity Tolerance: Patient limited by fatigue Patient left: in chair;with call bell/phone within reach;with  chair alarm set Nurse Communication: Mobility status PT Visit Diagnosis: Unsteadiness on feet (R26.81);Other abnormalities of gait and mobility (R26.89)     Time: 1032-1100 PT Time Calculation (min) (ACUTE ONLY): 28 min  Charges:  $Therapeutic Exercise: 8-22 mins $Therapeutic Activity: 8-22 mins                     Theodoro Grist, PT   Lelon Mast 03/21/2019, 11:04 AM

## 2019-03-21 NOTE — Progress Notes (Signed)
Call [facility] care. Gave report to Nurse [place]

## 2019-03-21 NOTE — TOC Progression Note (Addendum)
Transition of Care [facility]) - Progression Note    Patient Details  Name: Christine Kramer MRN: [id number] Date of Birth: 05/11/1921  Transition of [facility]) CM/SW Contact  Leeroy Cha, RN Phone Number: 03/21/2019, 1:18 PM  Clinical Narrative:    tcf-Kathy at [facility] care [facility] needs more information/tct-[phone] spoke with nina-information concerning pt and condition given to Nina/patient is out of network and case will have to go to the medical director for review.  Length of second call to [facility]-47 mins total time spent between facilities and family-131mins SNF [facility] rec'd will transport at 1900 to [place] ptar notified at 1600.  Expected Discharge Plan: Skilled Nursing Facility Barriers to Discharge: Insurance Authorization  Expected Discharge Plan and Services Expected Discharge Plan: [place]   Discharge Planning Services: CM Consult Post Acute Care Choice: [place] Living arrangements for the past 2 months: Single Family Home Expected Discharge Date: 03/19/19               DME Arranged: N/A DME Agency: NA       HH Arranged: NA HH Agency: NA         Social Determinants of Health (SDOH) Interventions    Readmission Risk Interventions No flowsheet data found.

## 2020-04-24 IMAGING — CT CT ABD-PELV W/O CM
2 of 4 series · 15 of 46 positions shown, 17 images · non-contrast
Comparison: None.

CLINICAL DATA: Nausea and vomiting

EXAM:
CT ABDOMEN AND PELVIS WITHOUT CONTRAST
TECHNIQUE: Multidetector CT imaging of the abdomen and pelvis was performed
following the standard protocol without IV contrast.

[Series 3: axial st · axial · 0.66mm/px · z∈[-839,-479]mm · 12 of 82 slices shown, 14 images]
[im 5/82  soft-tissue]
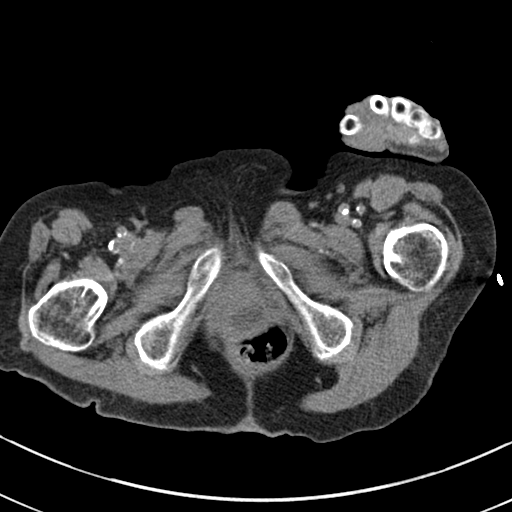
[im 5/82  bone]
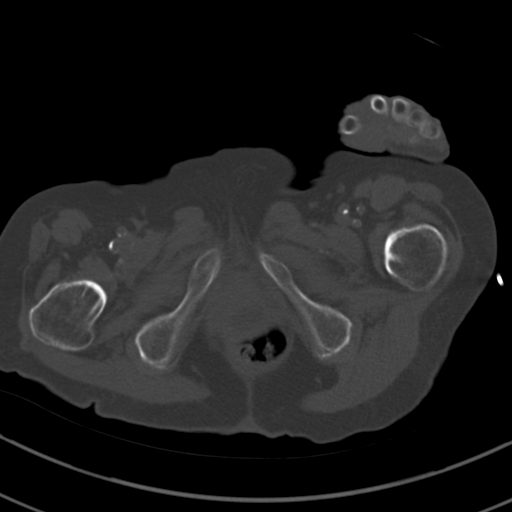
[im 13/82  soft-tissue]
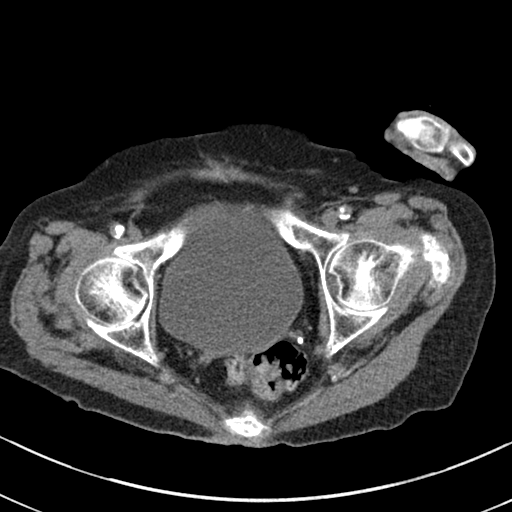
[im 17/82  soft-tissue]
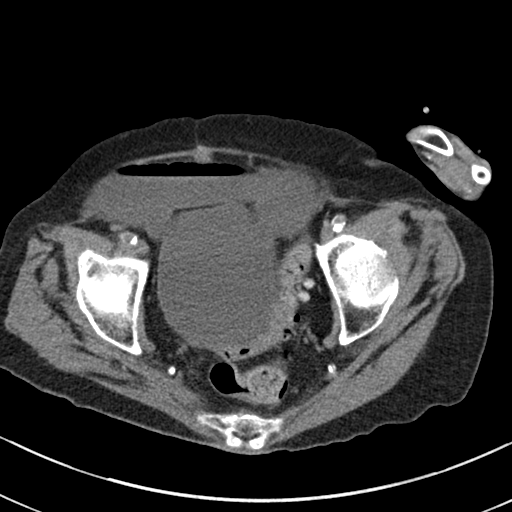
[im 25/82  soft-tissue]
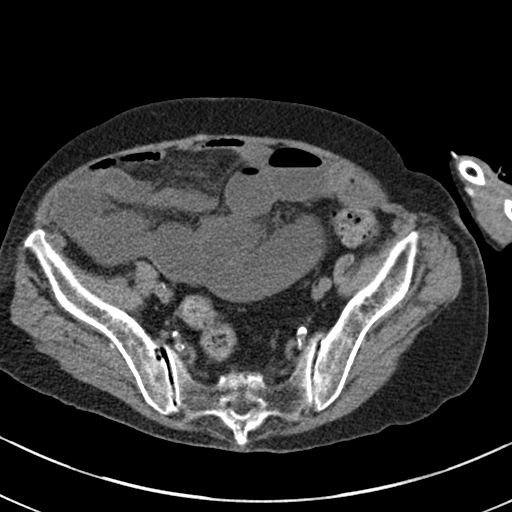
[im 33/82  soft-tissue]
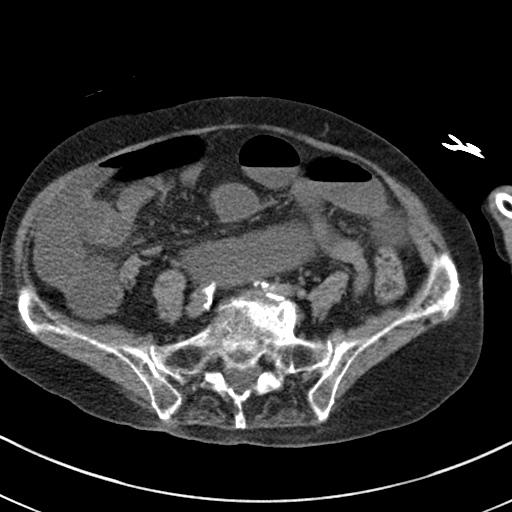
[im 37/82  soft-tissue]
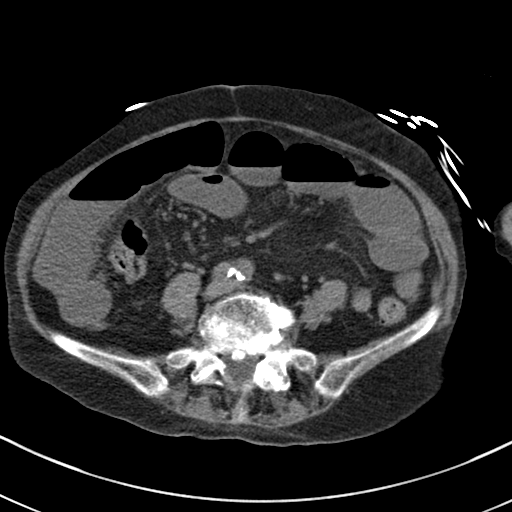
[im 45/82  soft-tissue]
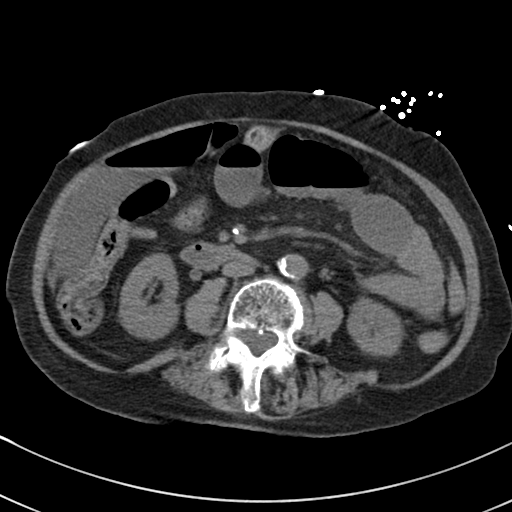
[im 49/82  soft-tissue]
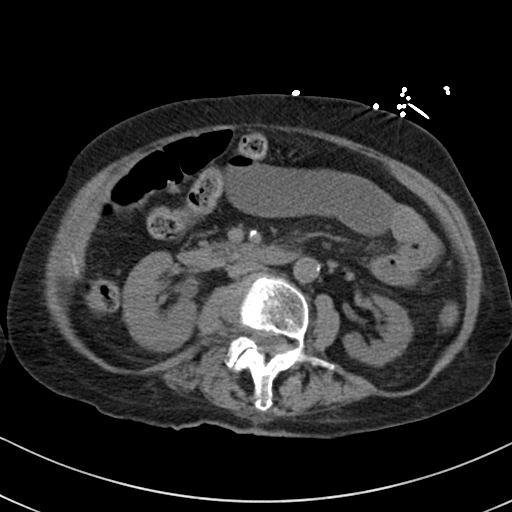
[im 57/82  soft-tissue]
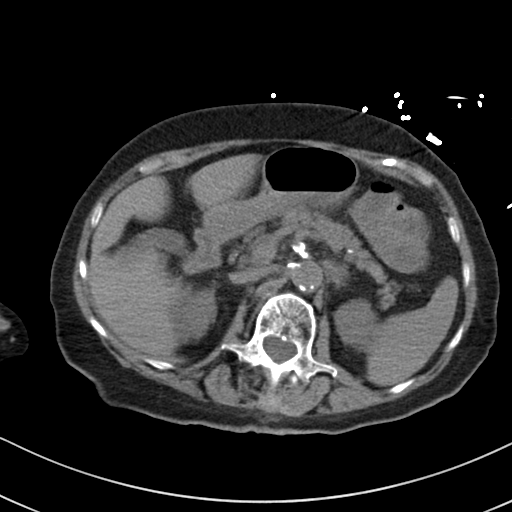
[im 57/82  bone]
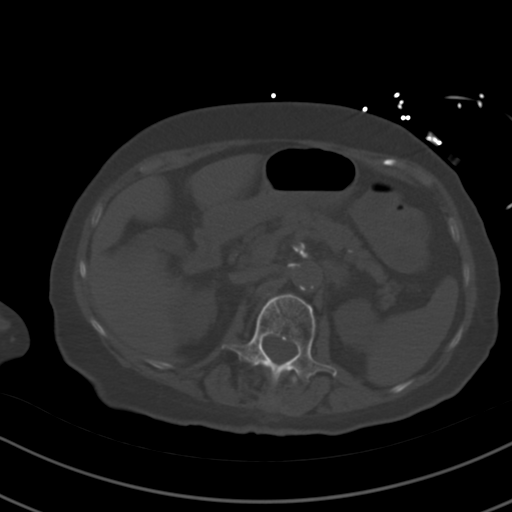
[im 65/82  soft-tissue]
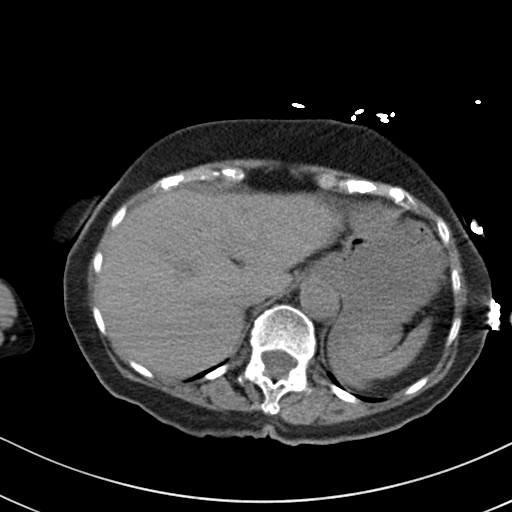
[im 69/82  soft-tissue]
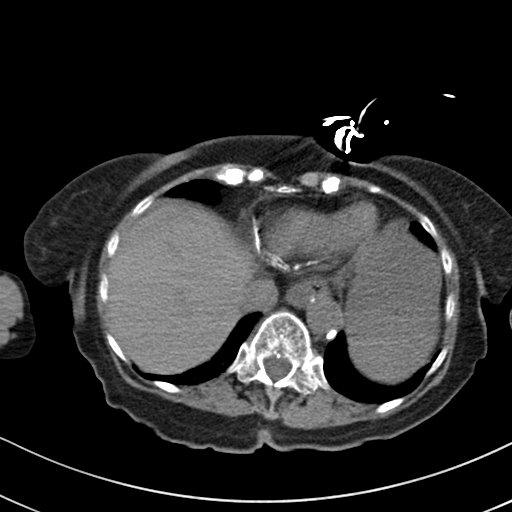
[im 77/82  soft-tissue]
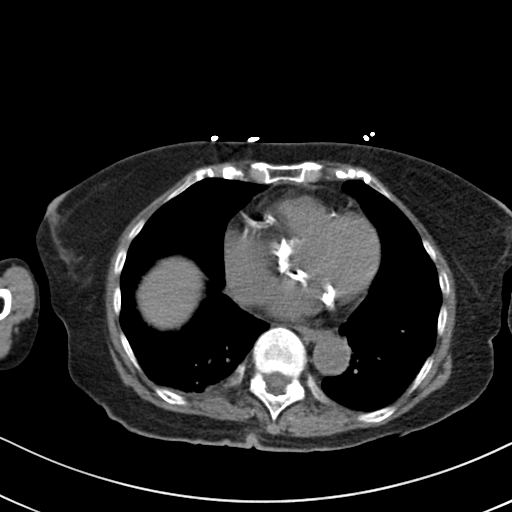

[Series 6: coronal st · coronal · 0.65mm/px · 3 of 105 slices shown]
[im 35/105  soft-tissue]
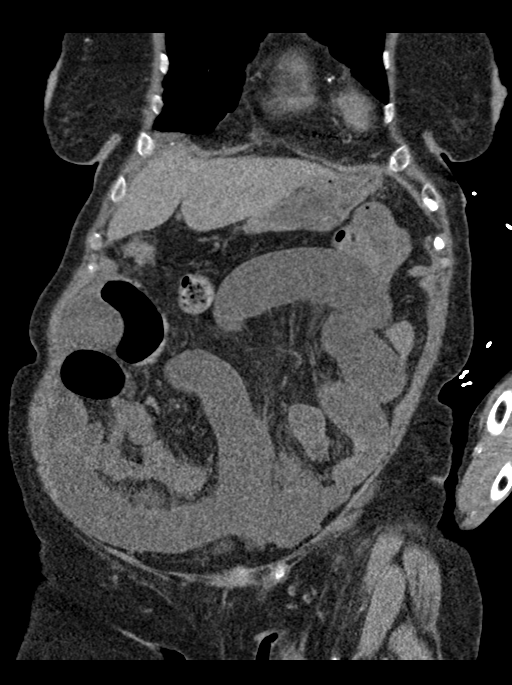
[im 47/105  soft-tissue]
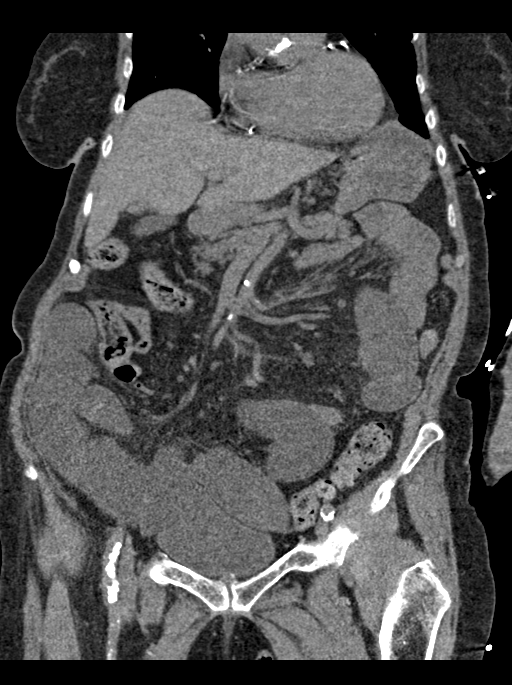
[im 58/105  soft-tissue]
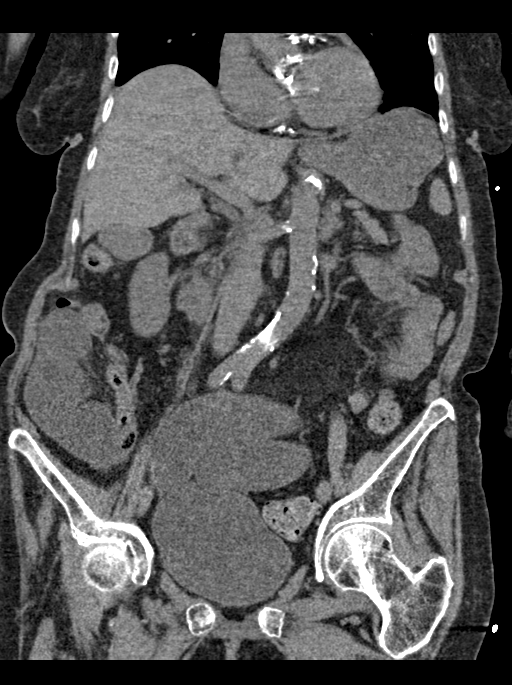

[15 of 46 positions shown; findings below may reference images not displayed]

FINDINGS: Lower chest: There is a trace right-sided pleural effusion.There are
advanced mitral valve calcifications and coronary artery
calcifications.

Hepatobiliary: The liver is normal. Normal gallbladder.There is no
biliary ductal dilation.

Pancreas: There is a 1.5 x 1.5 cm cystic appearing structure that
appears to be arising from the pancreatic tail.

Spleen: No splenic laceration or hematoma.

Adrenals/Urinary Tract:

--Adrenal glands: No adrenal hemorrhage.

--Right kidney/ureter: No hydronephrosis or perinephric hematoma.

--Left kidney/ureter: There is a nonobstructing stone in the
interpolar region of the left kidney.

--Urinary bladder: The urinary bladder is distended.

Stomach/Bowel:

--Stomach/Duodenum: No hiatal hernia or other gastric abnormality.
Normal duodenal course and caliber.

--Small bowel: There are dilated loops of small bowel measuring up
to approximately 4 cm with air-fluid levels. There appears to be a
transition point in the low midline abdomen (axial series 3, images
48 through 52).

--Colon: Rectosigmoid diverticulosis without acute inflammation.

--Appendix: Normal.

Vascular/Lymphatic: Atherosclerotic calcification is present within
the non-aneurysmal abdominal aorta, without hemodynamically
significant stenosis.

--No retroperitoneal lymphadenopathy.

--No mesenteric lymphadenopathy.

--No pelvic or inguinal lymphadenopathy.

Reproductive: Unremarkable

Other: No ascites or free air. The abdominal wall is normal.

Musculoskeletal. No acute displaced fractures.
IMPRESSION: 1. Small bowel obstruction with transition point in the low midline
abdomen.
2. Trace right-sided pleural effusion.
3. A 1.5 cm cystic mass that appears to be arising from the
pancreatic tail. This is favored to represent a pancreatic serous
cystadenoma.

Aortic Atherosclerosis ([id number]).

## 2020-04-26 IMAGING — DX DG ABD PORTABLE 1V
1 series · 1 of 1 positions shown · non-contrast
Comparison: CT 03/10/2019, radiograph 03/12/2019, 03/11/2019

CLINICAL DATA: Small bowel protocol 8 hour delay

EXAM:
PORTABLE ABDOMEN - 1 VIEW

[abdomen kub]
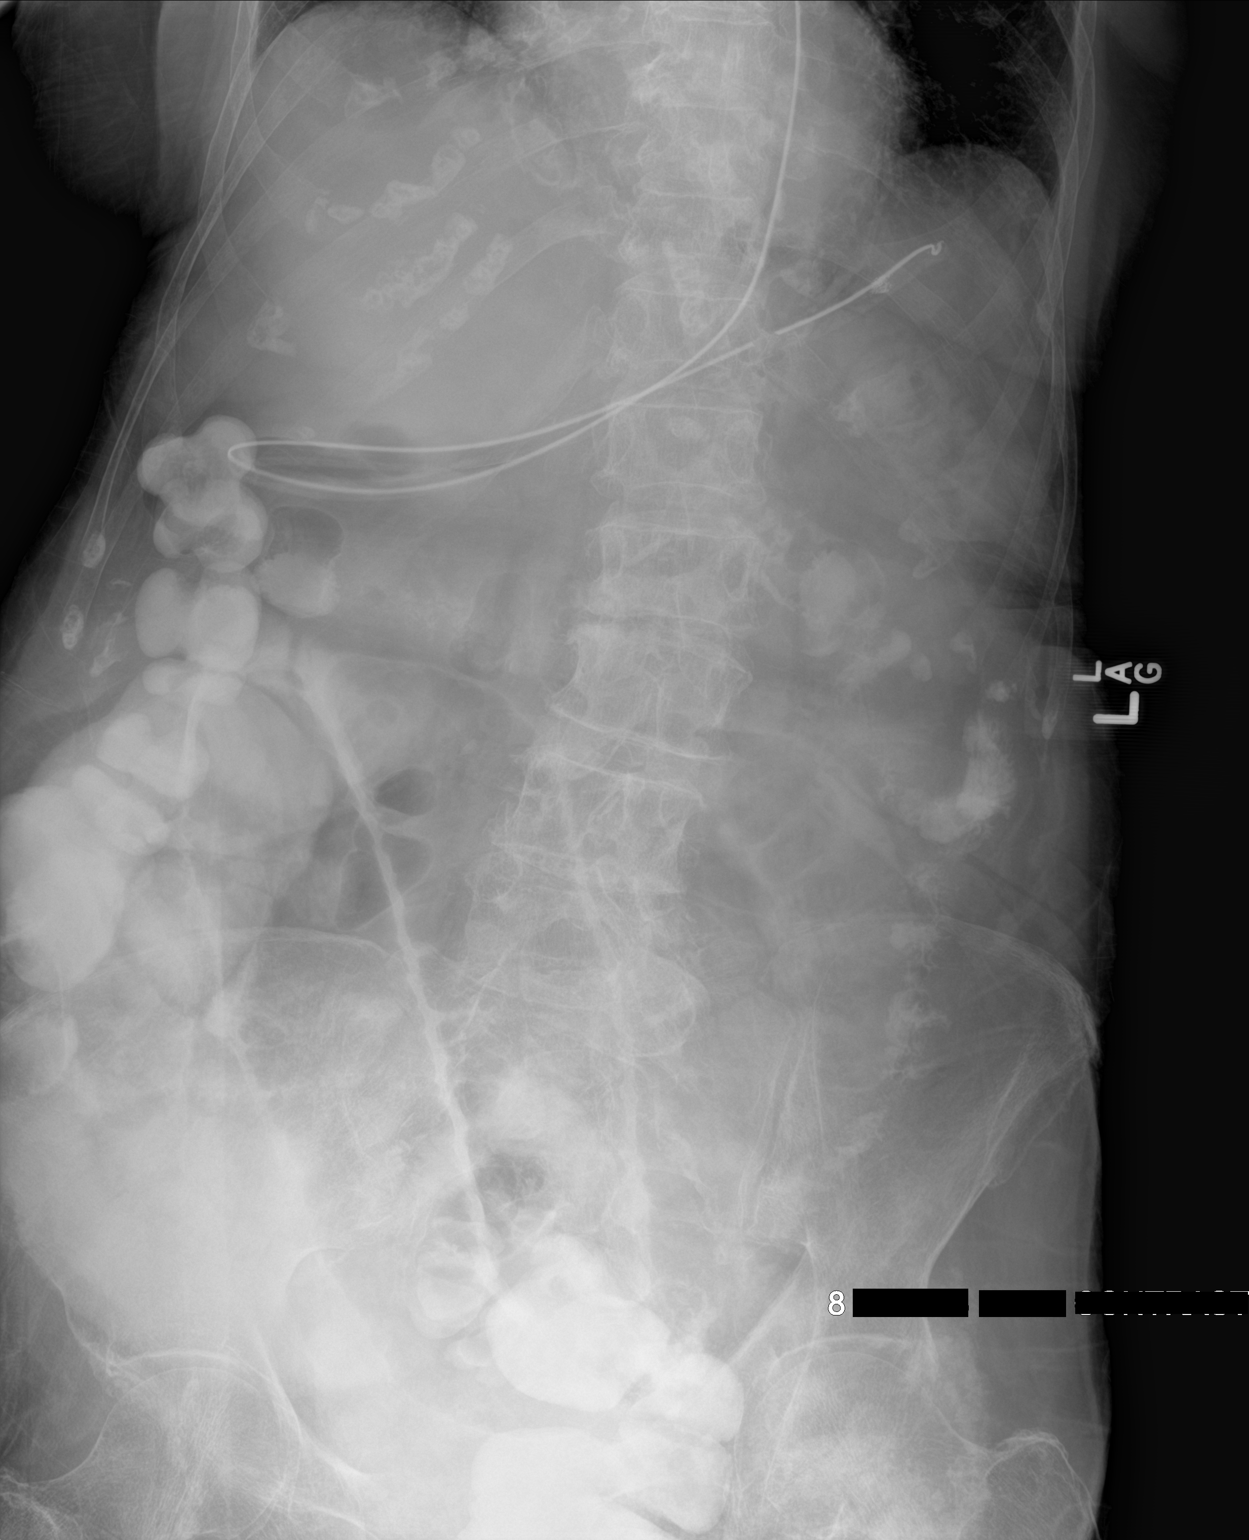

[1 of 1 positions shown; findings below may reference images not displayed]

FINDINGS: Esophageal tube is folded back upon itself in the stomach with the
tip position beneath the left hemidiaphragm. Radiopaque contrast
material is present within the colon. There remains however
significantly dilated central small bowel up to 5.2 cm.
IMPRESSION: 1. Radiopaque contrast material visible within the colon
2. Persistent prominent gaseous dilatation of central small bowel
consistent with obstruction
3. Esophageal tube is folded back upon itself in the stomach with
the tip positioned in the left upper quadrant

## 2020-06-15 DEATH — deceased
